# Patient Record
Sex: Male | Born: 1960 | Hispanic: No | Marital: Single | Smoking: Never smoker
Health system: Southern US, Community
[De-identification: ages and names within clinical notes are randomized; demographics above are authoritative.]

## PROBLEM LIST (undated history)

## (undated) DIAGNOSIS — I10 Essential (primary) hypertension: Secondary | ICD-10-CM

## (undated) DIAGNOSIS — C61 Malignant neoplasm of prostate: Secondary | ICD-10-CM

## (undated) HISTORY — PX: TESTICLE REMOVAL: SHX68

## (undated) HISTORY — DX: Essential (primary) hypertension: I10

## (undated) HISTORY — PX: FRACTURE SURGERY: SHX138

## (undated) HISTORY — PX: BICEPS TENDON REPAIR: SHX566

## (undated) HISTORY — PX: CATARACT EXTRACTION: SUR2

## (undated) HISTORY — DX: Malignant neoplasm of prostate: C61

---

## 2005-12-05 ENCOUNTER — Ambulatory Visit (HOSPITAL_COMMUNITY): Admission: RE | Admit: 2005-12-05 | Discharge: 2005-12-06 | Payer: Self-pay | Admitting: Orthopedic Surgery

## 2015-12-01 DIAGNOSIS — H43812 Vitreous degeneration, left eye: Secondary | ICD-10-CM | POA: Diagnosis not present

## 2015-12-01 DIAGNOSIS — H2513 Age-related nuclear cataract, bilateral: Secondary | ICD-10-CM | POA: Diagnosis not present

## 2015-12-01 DIAGNOSIS — H52223 Regular astigmatism, bilateral: Secondary | ICD-10-CM | POA: Diagnosis not present

## 2015-12-01 DIAGNOSIS — H5203 Hypermetropia, bilateral: Secondary | ICD-10-CM | POA: Diagnosis not present

## 2016-05-13 DIAGNOSIS — I10 Essential (primary) hypertension: Secondary | ICD-10-CM | POA: Diagnosis not present

## 2016-05-13 DIAGNOSIS — Z23 Encounter for immunization: Secondary | ICD-10-CM | POA: Diagnosis not present

## 2016-05-13 DIAGNOSIS — Z0000 Encounter for general adult medical examination without abnormal findings: Secondary | ICD-10-CM | POA: Diagnosis not present

## 2016-05-13 DIAGNOSIS — Z8739 Personal history of other diseases of the musculoskeletal system and connective tissue: Secondary | ICD-10-CM | POA: Diagnosis not present

## 2016-05-23 DIAGNOSIS — L821 Other seborrheic keratosis: Secondary | ICD-10-CM | POA: Diagnosis not present

## 2016-05-23 DIAGNOSIS — D225 Melanocytic nevi of trunk: Secondary | ICD-10-CM | POA: Diagnosis not present

## 2016-05-23 DIAGNOSIS — D485 Neoplasm of uncertain behavior of skin: Secondary | ICD-10-CM | POA: Diagnosis not present

## 2016-05-23 DIAGNOSIS — L720 Epidermal cyst: Secondary | ICD-10-CM | POA: Diagnosis not present

## 2016-05-23 DIAGNOSIS — L814 Other melanin hyperpigmentation: Secondary | ICD-10-CM | POA: Diagnosis not present

## 2016-06-07 DIAGNOSIS — L720 Epidermal cyst: Secondary | ICD-10-CM | POA: Diagnosis not present

## 2016-09-02 DIAGNOSIS — J019 Acute sinusitis, unspecified: Secondary | ICD-10-CM | POA: Diagnosis not present

## 2016-09-02 DIAGNOSIS — R05 Cough: Secondary | ICD-10-CM | POA: Diagnosis not present

## 2017-04-25 DIAGNOSIS — M109 Gout, unspecified: Secondary | ICD-10-CM | POA: Diagnosis not present

## 2017-05-02 DIAGNOSIS — H40033 Anatomical narrow angle, bilateral: Secondary | ICD-10-CM | POA: Diagnosis not present

## 2017-05-02 DIAGNOSIS — G44219 Episodic tension-type headache, not intractable: Secondary | ICD-10-CM | POA: Diagnosis not present

## 2017-05-11 DIAGNOSIS — H04123 Dry eye syndrome of bilateral lacrimal glands: Secondary | ICD-10-CM | POA: Diagnosis not present

## 2017-06-15 DIAGNOSIS — Z8739 Personal history of other diseases of the musculoskeletal system and connective tissue: Secondary | ICD-10-CM | POA: Diagnosis not present

## 2017-06-15 DIAGNOSIS — Z0000 Encounter for general adult medical examination without abnormal findings: Secondary | ICD-10-CM | POA: Diagnosis not present

## 2017-06-15 DIAGNOSIS — E785 Hyperlipidemia, unspecified: Secondary | ICD-10-CM | POA: Diagnosis not present

## 2017-06-15 DIAGNOSIS — R946 Abnormal results of thyroid function studies: Secondary | ICD-10-CM | POA: Diagnosis not present

## 2017-09-27 DIAGNOSIS — R946 Abnormal results of thyroid function studies: Secondary | ICD-10-CM | POA: Diagnosis not present

## 2017-09-27 DIAGNOSIS — Z8739 Personal history of other diseases of the musculoskeletal system and connective tissue: Secondary | ICD-10-CM | POA: Diagnosis not present

## 2017-10-18 DIAGNOSIS — M5416 Radiculopathy, lumbar region: Secondary | ICD-10-CM | POA: Diagnosis not present

## 2018-02-01 DIAGNOSIS — K080 Exfoliation of teeth due to systemic causes: Secondary | ICD-10-CM | POA: Diagnosis not present

## 2018-02-08 DIAGNOSIS — K080 Exfoliation of teeth due to systemic causes: Secondary | ICD-10-CM | POA: Diagnosis not present

## 2018-07-25 DIAGNOSIS — E785 Hyperlipidemia, unspecified: Secondary | ICD-10-CM | POA: Diagnosis not present

## 2018-07-25 DIAGNOSIS — R7301 Impaired fasting glucose: Secondary | ICD-10-CM | POA: Diagnosis not present

## 2018-07-25 DIAGNOSIS — I10 Essential (primary) hypertension: Secondary | ICD-10-CM | POA: Diagnosis not present

## 2018-07-25 DIAGNOSIS — Z0000 Encounter for general adult medical examination without abnormal findings: Secondary | ICD-10-CM | POA: Diagnosis not present

## 2018-07-25 DIAGNOSIS — Z8739 Personal history of other diseases of the musculoskeletal system and connective tissue: Secondary | ICD-10-CM | POA: Diagnosis not present

## 2018-08-13 DIAGNOSIS — J069 Acute upper respiratory infection, unspecified: Secondary | ICD-10-CM | POA: Diagnosis not present

## 2019-07-30 DIAGNOSIS — Z0000 Encounter for general adult medical examination without abnormal findings: Secondary | ICD-10-CM | POA: Diagnosis not present

## 2019-07-30 DIAGNOSIS — R7301 Impaired fasting glucose: Secondary | ICD-10-CM | POA: Diagnosis not present

## 2019-07-30 DIAGNOSIS — Z13220 Encounter for screening for lipoid disorders: Secondary | ICD-10-CM | POA: Diagnosis not present

## 2019-07-30 DIAGNOSIS — Z125 Encounter for screening for malignant neoplasm of prostate: Secondary | ICD-10-CM | POA: Diagnosis not present

## 2019-07-30 DIAGNOSIS — Z8739 Personal history of other diseases of the musculoskeletal system and connective tissue: Secondary | ICD-10-CM | POA: Diagnosis not present

## 2019-08-02 DIAGNOSIS — Z0000 Encounter for general adult medical examination without abnormal findings: Secondary | ICD-10-CM | POA: Diagnosis not present

## 2019-09-10 DIAGNOSIS — N402 Nodular prostate without lower urinary tract symptoms: Secondary | ICD-10-CM | POA: Diagnosis not present

## 2019-10-09 DIAGNOSIS — R944 Abnormal results of kidney function studies: Secondary | ICD-10-CM | POA: Diagnosis not present

## 2019-10-21 DIAGNOSIS — Z8042 Family history of malignant neoplasm of prostate: Secondary | ICD-10-CM | POA: Diagnosis not present

## 2019-10-21 DIAGNOSIS — C61 Malignant neoplasm of prostate: Secondary | ICD-10-CM | POA: Diagnosis not present

## 2019-10-21 DIAGNOSIS — N402 Nodular prostate without lower urinary tract symptoms: Secondary | ICD-10-CM | POA: Diagnosis not present

## 2019-10-21 DIAGNOSIS — D075 Carcinoma in situ of prostate: Secondary | ICD-10-CM | POA: Diagnosis not present

## 2019-10-29 DIAGNOSIS — C61 Malignant neoplasm of prostate: Secondary | ICD-10-CM | POA: Diagnosis not present

## 2020-04-27 DIAGNOSIS — C61 Malignant neoplasm of prostate: Secondary | ICD-10-CM | POA: Diagnosis not present

## 2020-05-04 DIAGNOSIS — N402 Nodular prostate without lower urinary tract symptoms: Secondary | ICD-10-CM | POA: Diagnosis not present

## 2020-05-04 DIAGNOSIS — C61 Malignant neoplasm of prostate: Secondary | ICD-10-CM | POA: Diagnosis not present

## 2020-05-04 DIAGNOSIS — G44219 Episodic tension-type headache, not intractable: Secondary | ICD-10-CM | POA: Diagnosis not present

## 2020-05-15 DIAGNOSIS — H1013 Acute atopic conjunctivitis, bilateral: Secondary | ICD-10-CM | POA: Diagnosis not present

## 2020-05-28 DIAGNOSIS — H04123 Dry eye syndrome of bilateral lacrimal glands: Secondary | ICD-10-CM | POA: Diagnosis not present

## 2020-05-28 DIAGNOSIS — H40033 Anatomical narrow angle, bilateral: Secondary | ICD-10-CM | POA: Diagnosis not present

## 2020-06-18 DIAGNOSIS — H25811 Combined forms of age-related cataract, right eye: Secondary | ICD-10-CM | POA: Diagnosis not present

## 2020-06-18 DIAGNOSIS — Z01818 Encounter for other preprocedural examination: Secondary | ICD-10-CM | POA: Diagnosis not present

## 2020-07-02 DIAGNOSIS — H25811 Combined forms of age-related cataract, right eye: Secondary | ICD-10-CM | POA: Diagnosis not present

## 2020-07-02 DIAGNOSIS — H2511 Age-related nuclear cataract, right eye: Secondary | ICD-10-CM | POA: Diagnosis not present

## 2020-09-23 ENCOUNTER — Other Ambulatory Visit: Payer: Self-pay | Admitting: Urology

## 2020-09-23 DIAGNOSIS — C61 Malignant neoplasm of prostate: Secondary | ICD-10-CM

## 2020-10-07 DIAGNOSIS — Z125 Encounter for screening for malignant neoplasm of prostate: Secondary | ICD-10-CM | POA: Diagnosis not present

## 2020-10-07 DIAGNOSIS — Z13220 Encounter for screening for lipoid disorders: Secondary | ICD-10-CM | POA: Diagnosis not present

## 2020-10-07 DIAGNOSIS — Z0000 Encounter for general adult medical examination without abnormal findings: Secondary | ICD-10-CM | POA: Diagnosis not present

## 2020-10-07 DIAGNOSIS — Z8739 Personal history of other diseases of the musculoskeletal system and connective tissue: Secondary | ICD-10-CM | POA: Diagnosis not present

## 2020-10-07 DIAGNOSIS — R7301 Impaired fasting glucose: Secondary | ICD-10-CM | POA: Diagnosis not present

## 2020-10-16 ENCOUNTER — Ambulatory Visit
Admission: RE | Admit: 2020-10-16 | Discharge: 2020-10-16 | Disposition: A | Discharge status: Home / Self Care | Payer: Federal, State, Local not specified - PPO | Source: Ambulatory Visit | Attending: Urology | Admitting: Urology

## 2020-10-16 ENCOUNTER — Other Ambulatory Visit: Payer: Self-pay

## 2020-10-16 DIAGNOSIS — C61 Malignant neoplasm of prostate: Secondary | ICD-10-CM | POA: Diagnosis not present

## 2020-10-16 MED ORDER — GADOBENATE DIMEGLUMINE 529 MG/ML IV SOLN
19.0000 mL | Freq: Once | INTRAVENOUS | Status: AC | PRN
  Administered 2020-10-16: 19 mL via INTRAVENOUS

## 2020-11-03 DIAGNOSIS — C61 Malignant neoplasm of prostate: Secondary | ICD-10-CM | POA: Diagnosis not present

## 2020-11-20 DIAGNOSIS — Z0000 Encounter for general adult medical examination without abnormal findings: Secondary | ICD-10-CM | POA: Diagnosis not present

## 2021-04-22 DIAGNOSIS — C61 Malignant neoplasm of prostate: Secondary | ICD-10-CM | POA: Diagnosis not present

## 2021-04-29 DIAGNOSIS — C61 Malignant neoplasm of prostate: Secondary | ICD-10-CM | POA: Diagnosis not present

## 2021-04-29 DIAGNOSIS — N402 Nodular prostate without lower urinary tract symptoms: Secondary | ICD-10-CM | POA: Diagnosis not present

## 2021-05-27 DIAGNOSIS — H40033 Anatomical narrow angle, bilateral: Secondary | ICD-10-CM | POA: Diagnosis not present

## 2021-05-27 DIAGNOSIS — H1013 Acute atopic conjunctivitis, bilateral: Secondary | ICD-10-CM | POA: Diagnosis not present

## 2021-09-16 DIAGNOSIS — Z86010 Personal history of colonic polyps: Secondary | ICD-10-CM | POA: Diagnosis not present

## 2021-09-16 DIAGNOSIS — D128 Benign neoplasm of rectum: Secondary | ICD-10-CM | POA: Diagnosis not present

## 2021-09-16 DIAGNOSIS — K648 Other hemorrhoids: Secondary | ICD-10-CM | POA: Diagnosis not present

## 2021-10-25 DIAGNOSIS — D075 Carcinoma in situ of prostate: Secondary | ICD-10-CM | POA: Diagnosis not present

## 2021-10-25 DIAGNOSIS — C61 Malignant neoplasm of prostate: Secondary | ICD-10-CM | POA: Diagnosis not present

## 2021-11-01 DIAGNOSIS — N402 Nodular prostate without lower urinary tract symptoms: Secondary | ICD-10-CM | POA: Diagnosis not present

## 2021-11-01 DIAGNOSIS — C61 Malignant neoplasm of prostate: Secondary | ICD-10-CM | POA: Diagnosis not present

## 2021-11-09 ENCOUNTER — Encounter (INDEPENDENT_AMBULATORY_CARE_PROVIDER_SITE_OTHER): Payer: Self-pay | Admitting: Ophthalmology

## 2021-11-09 ENCOUNTER — Ambulatory Visit (INDEPENDENT_AMBULATORY_CARE_PROVIDER_SITE_OTHER): Payer: Federal, State, Local not specified - PPO | Admitting: Ophthalmology

## 2021-11-09 ENCOUNTER — Other Ambulatory Visit: Payer: Self-pay

## 2021-11-09 DIAGNOSIS — H10013 Acute follicular conjunctivitis, bilateral: Secondary | ICD-10-CM | POA: Diagnosis not present

## 2021-11-09 DIAGNOSIS — H35033 Hypertensive retinopathy, bilateral: Secondary | ICD-10-CM

## 2021-11-09 DIAGNOSIS — Z961 Presence of intraocular lens: Secondary | ICD-10-CM | POA: Diagnosis not present

## 2021-11-09 DIAGNOSIS — H3321 Serous retinal detachment, right eye: Secondary | ICD-10-CM | POA: Diagnosis not present

## 2021-11-09 DIAGNOSIS — I10 Essential (primary) hypertension: Secondary | ICD-10-CM

## 2021-11-09 DIAGNOSIS — H25812 Combined forms of age-related cataract, left eye: Secondary | ICD-10-CM

## 2021-11-09 NOTE — Progress Notes (Signed)
?[facility] Note ? ?11/09/2021 ?  ? ?CHIEF COMPLAINT ?Patient presents for Retina Evaluation ? ? ?HISTORY OF PRESENT ILLNESS: ?Cristian Mcgee is a 61 y.o. male who presents to the clinic today for:  ? ?HPI   ? ? Retina Evaluation   ?In right eye.  This started 3 days ago.  Associated Symptoms Floaters.  Context:  distance vision, mid-range vision and near vision.  I, the attending physician,  performed the HPI with the patient and updated documentation appropriately. ? ?  ?  ? ? Comments   ?Retina eval per Dr. Marin Comment for RD OD- Saturday he noticed floaters OD.  Denies FOL's.  He woke up yesterday morning and had lost vision in that eye. About 85-90% of vision is gone.  Only able to see superotemporally.  ? ?  ?  ?Last edited by Bernarda Caffey, MD on 11/09/2021  1:01 PM.  ?  ?Pt states he noticed new floaters OD on Saturday, on Sunday, he started to notice that he was losing vision, he states yesterday, he woke up with almost all his vision gone, pt denies any heart or lung problems ? ?Referring physician: ?Harlen Labs, MD ?[phone] [facility] [address] ?[place],  [place] [address] ? ?HISTORICAL INFORMATION:  ? ?Selected notes from the [place] ?Referred by Dr. Anthony Sar for RD OD ?LEE:  ?Ocular Hx- ?PMH- ?  ? ?CURRENT MEDICATIONS: ?No current outpatient medications on file. (Ophthalmic Drugs)  ? ?No current facility-administered medications for this visit. (Ophthalmic Drugs)  ? ?Current Outpatient Medications (Other)  ?Medication Sig  ? allopurinol (ZYLOPRIM) 100 MG tablet Take 100 mg by mouth daily.  ? olmesartan (BENICAR) 20 MG tablet Take 20 mg by mouth daily.  ? ?No current facility-administered medications for this visit. (Other)  ? ?REVIEW OF SYSTEMS: ?ROS   ?Positive for: Eyes ?Negative for: Constitutional, Gastrointestinal, Neurological, Skin, Genitourinary, Musculoskeletal, HENT, Endocrine, Cardiovascular, Respiratory, Psychiatric, Allergic/Imm, Heme/Lymph ?Last edited by Leonie Douglas, COA  on 11/09/2021 10:28 AM.  ?  ? ?ALLERGIES ?No Known Allergies ? ?PAST MEDICAL HISTORY ?Past Medical History:  ?Diagnosis Date  ? Hypertension   ? Prostate cancer ([place])   ? ?Past Surgical History:  ?Procedure Laterality Date  ? CATARACT EXTRACTION Right   ? ?FAMILY HISTORY ?Family History  ?Problem Relation Age of Onset  ? Retinal detachment Mother   ? Diabetes Father   ? ?SOCIAL HISTORY ?Social History  ? ?Tobacco Use  ? Smoking status: Never  ? Smokeless tobacco: Never  ?  ? ?  ?OPHTHALMIC EXAM: ? ?Base Eye Exam   ? ? Visual Acuity (Snellen - Linear)   ? ?   Right Left  ? Dist [place] CF 1' temporally 20/20-  ? Dist ph [place] NI   ? ?  ?  ? ? Tonometry (Tonopen, 10:35 AM)   ? ?   Right Left  ? Pressure 19 18  ? ?  ?  ? ? Pupils   ? ?   Shape  ? Right Dilated  ? Left Dilated  ? ?  ?  ? ? Visual Fields (Counting fingers)   ? ?   Left Right  ?  Full   ? Restrictions  Total superior nasal, inferior nasal deficiencies; Partial inner superior temporal, inferior temporal deficiencies  ? ?  ?  ? ? Extraocular Movement   ? ?   Right Left  ?  Full Full  ? ?  ?  ? ?  Neuro/Psych   ? ? Oriented x3: Yes  ? Mood/Affect: Normal  ? ?  ?  ? ? Dilation   ? ? Both eyes: Dilated @ 09:15 AM  ? ?  ?  ? ?  ? ?Slit Lamp and Fundus Exam   ? ? Slit Lamp Exam   ? ?   Right Left  ? Lids/Lashes Dermatochalasis - upper lid Dermatochalasis - upper lid  ? Conjunctiva/Sclera Trace Injection, nasal and temporal pinguecula Trace Injection, nasal and temporal pinguecula  ? Cornea trace PEE, well healed cataract wound mild tear film debris  ? Anterior Chamber Deep and quiet Deep and quiet  ? Iris Round and dilated Round and dilated  ? Lens PC IOL in good position 2+ Nuclear sclerosis, 2+ Cortical cataract  ? Anterior Vitreous Vitreous syneresis, vitreous condensations / debris Vitreous syneresis  ? ?  ?  ? ? Fundus Exam   ? ?   Right Left  ? Disc partially obscured by bullous RD, Pink and Sharp Pink and [place]  ? C/D Ratio 0.2 0.2  ? Macula Detached,  partially obscured by bullous RD Flat, Good foveal reflex, RPE mottling, No heme or edema  ? Vessels attenuated, Tortuous Mild copper wiring  ? Periphery Bullous ST detachment from 0900-1200 extending posteriorly into macula, no obvious RT on depression, paving stone degeneration inferiorly Attached, mild pavingstone degeneration inferiorly  ? ?  ?  ? ?  ? ? ?IMAGING AND PROCEDURES  ?Imaging and Procedures for 11/09/2021 ? ?OCT, Retina - OU - Both Eyes   ? ?   ?Right Eye ?Quality was good. Progression has no prior data. Findings include abnormal foveal contour, intraretinal fluid, no SRF (Bullous macula and fovea involving RD with fine vitreous opacities).  ? ?Left Eye ?Quality was good. Central Foveal Thickness: 312. Findings include normal foveal contour, no IRF, no SRF, vitreomacular adhesion .  ? ?Notes ?*Images captured and stored on drive ? ?Diagnosis / Impression:  ?OD: bullous macula and fovea-involving RD - +SRF, mild IRF/cystic changes within detached retina ?OS: NFP; no IRF/SRF  ? ?Clinical management:  ?See below ? ?Abbreviations: NFP - Normal foveal profile. CME - cystoid macular edema. PED - pigment epithelial detachment. IRF - intraretinal fluid. SRF - subretinal fluid. EZ - ellipsoid zone. ERM - epiretinal membrane. ORA - outer retinal atrophy. ORT - outer retinal tubulation. SRHM - subretinal hyper-reflective material. IRHM - intraretinal hyper-reflective material ? ? ?  ? ?Color Fundus Photography Optos - OU - Both Eyes   ? ?   ?Right Eye ?Progression has no prior data. Disc findings include normal observations. Macula : detached. Vessels : tortuous vessels, attenuated. Periphery : detachment.  ? ?Left Eye ?Progression has no prior data. Disc findings include normal observations. Macula : normal observations. Vessels : tortuous vessels, attenuated. Periphery : normal observations.  ? ?Notes ?**Images stored on drive** ? ?Impression: ?OD: bullous macula and fovea- involving RD, ST quad from  0900-1200 ?OS: normal study ? ?  ? ? ?  ?  ? ?  ?ASSESSMENT/PLAN: ? ?  ICD-10-CM   ?1. Right retinal detachment  H33.21 OCT, Retina - OU - Both Eyes  ?  Color Fundus Photography Optos - OU - Both Eyes  ?  ?2. Essential hypertension  I10   ?  ?3. Hypertensive retinopathy of both eyes  H35.033 OCT, Retina - OU - Both Eyes  ?  ?4. Pseudophakia  Z96.1   ?  ?5. Combined forms of age-related cataract of left  eye  H25.812   ?  ? ?Rhegmatogenous retinal detachment, OD ?- bullous superior mac off detachment, onset of foveal involvement yesterday, 11/08/2021 by history ?- detached from 0900 to 12 oclock ?- Pt reports +family history of RD in mother ?- The incidence, risk factors, and natural history of retinal detachment was discussed with patient.   ?- Potential treatment options including delimiting laser, pneumatic retinopexy, scleral buckle, and vitrectomy, cryotherapy and laser, and the use of air, gas, and oil discussed with patient. ?- The risks of blindness, loss of vision, infection, hemorrhage, cataract progression or lens displacement were discussed with patient. ?- recommend SBP + 25g PPV/EL/Gas OD under general anesthesia ?- pt wishes to proceed with surgery ?- RBA of procedure discussed, questions answered ?- informed consent obtained and signed ?- case scheduled for Thursday, March 30 at 3:30, [facility] OR 8 ?- f/u POD1 ? ?2,3. Hypertensive retinopathy OU ?- discussed importance of tight BP control ?- monitor ? ?4. Pseudophakia OD ? - s/p CE/IOL w/ Dr. Alanda Slim ? - IOL in good position, doing well ? - monitor  ? ?5. Mixed Cataract OS ?- The symptoms of cataract, surgical options, and treatments and risks were discussed with patient. ?- discussed diagnosis and progression ?- monitor for now  ? ?Ophthalmic Meds Ordered this visit:  ?No orders of the defined types were placed in this encounter. ?  ? ?Return in about 3 days (around 11/12/2021) for POV. ? ?There are no Patient Instructions on file for this  visit. ? ? ?Explained the diagnoses, plan, and follow up with the patient and they expressed understanding.  Patient expressed understanding of the importance of proper follow up care.  ? ?This document serves as a record of service

## 2021-11-10 ENCOUNTER — Other Ambulatory Visit: Payer: Self-pay

## 2021-11-10 ENCOUNTER — Encounter (HOSPITAL_COMMUNITY): Payer: Self-pay | Admitting: Ophthalmology

## 2021-11-10 DIAGNOSIS — M109 Gout, unspecified: Secondary | ICD-10-CM | POA: Diagnosis not present

## 2021-11-10 DIAGNOSIS — I10 Essential (primary) hypertension: Secondary | ICD-10-CM | POA: Diagnosis not present

## 2021-11-10 DIAGNOSIS — R7301 Impaired fasting glucose: Secondary | ICD-10-CM | POA: Diagnosis not present

## 2021-11-10 NOTE — Progress Notes (Addendum)
Anesthesia Chart Review: SAME DAY WORK-UP ? Case: [id number] Date/Time: 11/11/21 1515  ? Procedure: VITRECTOMY 25 GAUGE WITH SCLERAL BUCKLE (Right)  ? Anesthesia type: General  ? Pre-op diagnosis: right eye, retinal detachment  ? Location: [facility] OR ROOM 08 / [place] OR  ? Surgeons: Bernarda Caffey, MD  ? ?  ? ? ?DISCUSSION: Patient is a 61 year old male scheduled for the above procedure. ? ?History includes never smoker, HTN, prostate cancer. ? ?Patient reportedly had preoperative medical evaluation at Dr. Jordan Hawks Rankin's office on 11/10/21. A BMET, CBD with diff, and A1C were drawn. Results are pending. Records requested and limited review available in [facility]. Office said they would fax over records/clearance once labs had been reviewed.  ? ?Anesthesia team evaluation and follow-up requested records on the day of surgery.  ? ?UPDATE 11/11/21 10:37 AM: RN staff contacted Dr. Dahlia Bailiff office for records. 11/10/2021 lab results received from [place].  Results included A1c 5.8%, glucose 107, creatinine 1.23, sodium 139, potassium 5.6, BUN 18, calcium 9.9, albumin 5.0, WBC 10.6, hemoglobin 15.3, hematocrit 44.7, platelet count 225. ? ?VS: Wt 94.3 kg  ON 10/2921 ([facility]): WT 205.2 lbs, HT 70 in, BMI 29.44, BP 131/78, O2 sat 99% on RA.   ? ?  ?PROVIDERS: ?Rankins, Bill Salinas, MD is PCP [facility]) ?Alexis Frock, MD is urologist ? ? ?LABS: See DISCUSSION. ? ? ?IMAGES: ?MRI prostate 10/16/20: ?IMPRESSION: ?1. 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 ?2. Hazy low T2 signal stranding in both peripheral zones, likely ?postinflammatory. ?3. Mild prostatomegaly. 08 cubic cm. ?  ? ?EKG: EKG on the day of surgery (unless receive tracing without past year) anticipated given history.  ? ? ?CV: N/A ? ?Past Medical History:  ?Diagnosis Date  ? Hypertension   ? Prostate cancer ([place])   ? ? ?Past Surgical History:  ?Procedure  Laterality Date  ? BICEPS TENDON REPAIR Right   ? "reattach bicep on right arm"- 18 years ago per patient (11/10/21)  ? CATARACT EXTRACTION Right   ? FRACTURE SURGERY Left   ? 35 years ago  ? TESTICLE REMOVAL    ? as a child had 1 testicle removed  ? ? ?MEDICATIONS: ?No current facility-administered medications for this encounter.  ? ? allopurinol (ZYLOPRIM) 100 MG tablet  ? olmesartan (BENICAR) 20 MG tablet  ? ? ?Myra Gianotti, PA-C ?Surgical Short Stay/Anesthesiology ?[facility] Phone [phone] ?[facility] Phone [phone] ?11/10/2021 12:50 PM ? ? ? ? ? ? ? ? ?

## 2021-11-10 NOTE — Anesthesia Preprocedure Evaluation (Addendum)
Anesthesia Evaluation  ?Patient identified by MRN, date of birth, ID band ?Patient awake ? ? ? ?Reviewed: ?Allergy & Precautions, NPO status , Patient's Chart, lab work & pertinent test results ? ?History of Anesthesia Complications ?Negative for: history of anesthetic complications ? ?Airway ?Mallampati: III ? ?TM Distance: >3 FB ?Neck ROM: Full ? ? ? Dental ? ?(+) Teeth Intact, Dental Advisory Given ?  ?Pulmonary ?neg pulmonary ROS,  ?  ?breath sounds clear to auscultation ? ? ? ? ? ? Cardiovascular ?hypertension, Pt. on medications ? ?Rhythm:Regular  ? ?  ?Neuro/Psych ?negative neurological ROS ?   ? GI/Hepatic ?negative GI ROS, Neg liver ROS,   ?Endo/Other  ?negative endocrine ROS ? Renal/GU ?negative Renal ROS  ? ?H/o prostate cancer ? ?  ?Musculoskeletal ?negative musculoskeletal ROS ?(+)  ? Abdominal ?  ?Peds ? Hematology ?negative hematology ROS ?(+)   ?Anesthesia Other Findings ? ? Reproductive/Obstetrics ? ?  ? ? ? ? ? ? ? ? ? ? ? ? ? ?  ?  ? ? ? ?Anesthesia Physical ?Anesthesia Plan ? ?ASA: 2 ? ?Anesthesia Plan: General  ? ?Post-op Pain Management: Minimal or no pain anticipated  ? ?Induction: Intravenous ? ?PONV Risk Score and Plan: 2 and Ondansetron, Treatment may vary due to age or medical condition, Midazolam and Dexamethasone ? ?Airway Management Planned: Oral ETT ? ?Additional Equipment: None ? ?Intra-op Plan:  ? ?Post-operative Plan: Extubation in OR ? ?Informed Consent: I have reviewed the patients History and Physical, chart, labs and discussed the procedure including the risks, benefits and alternatives for the proposed anesthesia with the patient or authorized representative who has indicated his/her understanding and acceptance.  ? ? ? ?Dental advisory given ? ?Plan Discussed with:  ? ?Anesthesia Plan Comments: (PAT note written 11/10/2021 by Myra Gianotti, PA-C. PCP Dr. Dahlia Bailiff office to fax over preoperative evaluation and CBC, BMET, A1c results from  11/10/21 once available on 11/11/21.  ? ?11/10/2021 lab results received from [place].  Results included A1c 5.8%, glucose 107, creatinine 1.23, sodium 139, potassium 5.6, BUN 18, calcium 9.9, albumin 5.0, WBC 10.6, hemoglobin 15.3, hematocrit 44.7, platelet count 225.)  ? ? ? ? ?Anesthesia Quick Evaluation ? ?

## 2021-11-10 NOTE — H&P (Signed)
Cristian Mcgee is an 61 y.o. male.   ? ?Chief Complaint: retinal detachment, OD ? ?HPI: Pt with 5-day history of floaters and progressive vision loss OD. On dilated exam, found to have a macula-involving retinal detachment of the right eye. After a discussion of the risks, benefits and alternatives to surgery, the patient has elected to proceed with surgical repair of the detachment in the right eye under general anesthesia. ? ?Past Medical History:  ?Diagnosis Date  ? Hypertension   ? Prostate cancer ([place])   ? ? ?Past Surgical History:  ?Procedure Laterality Date  ? CATARACT EXTRACTION Right   ? ? ?Family History  ?Problem Relation Age of Onset  ? Retinal detachment Mother   ? Diabetes Father   ? ?Social History:  reports that he has never smoked. He has never used smokeless tobacco. No history on file for alcohol use and drug use. ? ?Allergies: No Known Allergies ? ?No medications prior to admission.  ? ? ?Review of systems otherwise negative ? ?There were no vitals taken for this visit. ? ?Physical exam: ?Mental status: oriented x3. ?Eyes: See eye exam associated with this date of surgery ?Ears, Nose, Throat: within normal limits ?Neck: Within Normal limits ?General: within normal limits ?Chest: Within normal limits ?Breast: deferred ?Heart: Within normal limits ?Abdomen: Within normal limits ?GU: deferred ?Extremities: within normal limits ?Skin: within normal limits ? ?Assessment/Plan ?Retinal detachment, right eye ?Plan: To [facility] for SBP + 25g PPV w/ endolaser and gas, RIGHT EYE, under general anesthesia ?- case scheduled for Thursday, Mar 30, [facility] OR 08 ? ?Gardiner Sleeper, M.D., Ph.D. ?Vitreoretinal Surgeon ?[facility] ? ?  ?

## 2021-11-10 NOTE — Progress Notes (Addendum)
SDW CALL ? ?Patient was given pre-op instructions over the phone. The opportunity was given for the patient to ask questions. No further questions asked. Patient verbalized understanding of instructions given. ? ? ?PCP - Milagros Evener, MD- pt saw PCP today for medical clearance- Pt states lab work was done at PCP appointment, pt unsure which lab work was done. [name] at [facility] called and asked to fax notes, labs and clearance sent. Per office, pt had HgbA1c, CBC, and BMET- labs have not resulted yet. Office to fax over office note/clearance note, and labs as soon as possible.  ? ?Cardiologist - denies ? ?PPM/ICD - denies ?Chest x-ray - N/A ?EKG - DOS ?Stress Test - denies ?ECHO - denies ?Cardiac Cath - denies ? ?Sleep Study - denies ? ?COVID TEST- N/A ? ?Anesthesia review: Ebony Hail and Jeneen Rinks notified of waiting for medical clearance/labs.  ? ?Patient denies shortness of breath, fever, cough and chest pain over the phone call ? ? ? ?Surgical Instructions ? ? ? Your procedure is scheduled on Thursday, March 30 ? Report to [facility] Main Entrance "A" at 1:00PM., then check in with the Admitting office. ? Call this number if you have problems the morning of surgery: ? [phone] ? ? ? Remember: ? Do not eat after midnight the night before your surgery ? ?You may drink clear liquids until 12:30PM the morning of your surgery.   ?Clear liquids allowed are: Water, Non-Citrus Juices (without pulp), Carbonated Beverages, Clear Tea, Black Coffee ONLY (NO MILK, CREAM OR POWDERED CREAMER of any kind), and Gatorade ? ? Take these medicines the morning of surgery with A SIP OF WATER: allopurinol ? ? ?As of today, STOP taking any Aspirin (unless otherwise instructed by your surgeon) Aleve, Naproxen, Ibuprofen, Motrin, Advil, Goody's, BC's, all herbal medications, fish oil, and all vitamins. ? ?[facility] is not responsible for any belongings or valuables.  ? ?Do NOT Smoke (Tobacco/Vaping)  24 hours prior to  your procedure ?  ?Contacts, glasses, hearing aids, dentures or partials may not be worn into surgery, please bring cases for these belongings ?  ?Patients discharged the day of surgery will not be allowed to drive home, and someone needs to stay with them for 24 hours. ? ? ?SURGICAL WAITING ROOM VISITATION ?No visitors are allowed in pre-op area with patient.  ?Patients having surgery or a procedure in a hospital may have two support people in the waiting room. ?  ? ? ? ?Special instructions:   ? ?Oral Hygiene is also important to reduce your risk of infection.  Remember - BRUSH YOUR TEETH THE MORNING OF SURGERY WITH YOUR REGULAR TOOTHPASTE ? ? ?Day of Surgery: ? ?Take a shower the day of or night before with antibacterial soap. ?Wear Clean/Comfortable clothing the morning of surgery ?Do not apply any deodorants/lotions.   ?Do not wear jewelry or makeup ?Do not wear lotions, powders, perfumes/colognes, or deodorant. ?Do not shave 48 hours prior to surgery.  Men may shave face and neck. ?Do not bring valuables to the hospital. ?Do not wear nail polish, gel polish, artificial nails, or any other type of covering on natural nails (fingers and toes) ?If you have artificial nails or gel coating that need to be removed by a nail salon, please have this removed prior to surgery. Artificial nails or gel coating may interfere with anesthesia's ability to adequately monitor your vital signs. ?Remember to brush your teeth WITH YOUR REGULAR TOOTHPASTE. ? ? ? ? ? ?

## 2021-11-11 ENCOUNTER — Ambulatory Visit (HOSPITAL_COMMUNITY): Payer: Federal, State, Local not specified - PPO | Admitting: Vascular Surgery

## 2021-11-11 ENCOUNTER — Ambulatory Visit (HOSPITAL_COMMUNITY)
Admission: RE | Admit: 2021-11-11 | Discharge: 2021-11-11 | Disposition: A | Discharge status: Home / Self Care | Payer: Federal, State, Local not specified - PPO | Attending: Ophthalmology | Admitting: Ophthalmology

## 2021-11-11 ENCOUNTER — Encounter (HOSPITAL_COMMUNITY): Payer: Self-pay | Admitting: Ophthalmology

## 2021-11-11 ENCOUNTER — Other Ambulatory Visit: Payer: Self-pay

## 2021-11-11 ENCOUNTER — Encounter (HOSPITAL_COMMUNITY)
Admission: RE | Disposition: A | Discharge status: Home / Self Care | Payer: Self-pay | Source: Home / Self Care | Attending: Ophthalmology

## 2021-11-11 DIAGNOSIS — H33001 Unspecified retinal detachment with retinal break, right eye: Secondary | ICD-10-CM | POA: Insufficient documentation

## 2021-11-11 DIAGNOSIS — H338 Other retinal detachments: Secondary | ICD-10-CM | POA: Diagnosis not present

## 2021-11-11 DIAGNOSIS — Z8546 Personal history of malignant neoplasm of prostate: Secondary | ICD-10-CM | POA: Diagnosis not present

## 2021-11-11 DIAGNOSIS — I10 Essential (primary) hypertension: Secondary | ICD-10-CM | POA: Diagnosis not present

## 2021-11-11 HISTORY — PX: VITRECTOMY 25 GAUGE WITH SCLERAL BUCKLE: SHX6183

## 2021-11-11 HISTORY — PX: PHOTOCOAGULATION WITH LASER: SHX6027

## 2021-11-11 HISTORY — PX: GAS INSERTION: SHX5336

## 2021-11-11 HISTORY — PX: GAS/FLUID EXCHANGE: SHX5334

## 2021-11-11 LAB — BASIC METABOLIC PANEL
Anion gap: 10 (ref 5–15)
BUN: 17 mg/dL (ref 8–23)
CO2: 20 mmol/L — ABNORMAL LOW (ref 22–32)
Calcium: 8.9 mg/dL (ref 8.9–10.3)
Chloride: 104 mmol/L (ref 98–111)
Creatinine, Ser: 1.17 mg/dL (ref 0.61–1.24)
GFR, Estimated: >60 mL/min (ref >60)
Glucose, Bld: 102 mg/dL — ABNORMAL HIGH (ref 70–99)
Potassium: 3.8 mmol/L (ref 3.5–5.1)
Sodium: 134 mmol/L — ABNORMAL LOW (ref 135–145)

## 2021-11-11 LAB — CBC
HCT: 41.9 % (ref 39.0–52.0)
Hemoglobin: 14.2 g/dL (ref 13.0–17.0)
MCH: 30 pg (ref 26.0–34.0)
MCHC: 33.9 g/dL (ref 30.0–36.0)
MCV: 88.4 fL (ref 80.0–100.0)
Platelets: 200 10*3/uL (ref 150–400)
RBC: 4.74 MIL/uL (ref 4.22–5.81)
RDW: 13.1 % (ref 11.5–15.5)
WBC: 8.3 10*3/uL (ref 4.0–10.5)
nRBC: 0 % (ref 0.0–0.2)

## 2021-11-11 SURGERY — VITRECTOMY, USING 25-GAUGE INSTRUMENTS, WITH SCLERAL BUCKLING
Anesthesia: General | Site: Eye | Laterality: Right

## 2021-11-11 MED ORDER — LIDOCAINE 2% (20 MG/ML) 5 ML SYRINGE
INTRAMUSCULAR | Status: AC
  Filled 2021-11-11: qty 5

## 2021-11-11 MED ORDER — TRIAMCINOLONE ACETONIDE 40 MG/ML IJ SUSP
INTRAMUSCULAR | Status: DC | PRN
  Administered 2021-11-11: .5 mL via INTRAMUSCULAR

## 2021-11-11 MED ORDER — BRIMONIDINE TARTRATE 0.2 % OP SOLN
OPHTHALMIC | Status: DC | PRN
  Administered 2021-11-11: 1 [drp] via OPHTHALMIC

## 2021-11-11 MED ORDER — DEXTROSE 5 % IV SOLN
INTRAVENOUS | Status: AC
  Filled 2021-11-11: qty 100

## 2021-11-11 MED ORDER — LACTATED RINGERS IV SOLN
INTRAVENOUS | Status: DC | PRN
Start: 2021-11-11 — End: 2021-11-11

## 2021-11-11 MED ORDER — STERILE WATER FOR INJECTION IJ SOLN
INTRAMUSCULAR | Status: AC
  Filled 2021-11-11: qty 10

## 2021-11-11 MED ORDER — BSS IO SOLN
INTRAOCULAR | Status: AC
  Filled 2021-11-11: qty 15

## 2021-11-11 MED ORDER — SODIUM CHLORIDE (PF) 0.9 % IJ SOLN
INTRAMUSCULAR | Status: AC
  Filled 2021-11-11: qty 10

## 2021-11-11 MED ORDER — PREDNISOLONE ACETATE 1 % OP SUSP
OPHTHALMIC | Status: AC
  Filled 2021-11-11: qty 5

## 2021-11-11 MED ORDER — EPHEDRINE 5 MG/ML INJ
INTRAVENOUS | Status: AC
  Filled 2021-11-11: qty 5

## 2021-11-11 MED ORDER — CHLORHEXIDINE GLUCONATE 0.12 % MT SOLN
OROMUCOSAL | Status: AC
  Administered 2021-11-11: 15 mL via OROMUCOSAL
  Filled 2021-11-11: qty 15

## 2021-11-11 MED ORDER — CYCLOPENTOLATE HCL 1 % OP SOLN
1.0000 [drp] | OPHTHALMIC | Status: AC | PRN
  Administered 2021-11-11 (×3): 1 [drp] via OPHTHALMIC
  Filled 2021-11-11: qty 2

## 2021-11-11 MED ORDER — BACITRACIN-POLYMYXIN B 500-10000 UNIT/GM OP OINT
TOPICAL_OINTMENT | OPHTHALMIC | Status: DC | PRN
  Administered 2021-11-11: 1 via OPHTHALMIC

## 2021-11-11 MED ORDER — EPHEDRINE SULFATE-NACL 50-0.9 MG/10ML-% IV SOSY
PREFILLED_SYRINGE | INTRAVENOUS | Status: DC | PRN
  Administered 2021-11-11 (×5): 5 mg via INTRAVENOUS

## 2021-11-11 MED ORDER — BUPIVACAINE HCL (PF) 0.75 % IJ SOLN
INTRAMUSCULAR | Status: AC
  Filled 2021-11-11: qty 10

## 2021-11-11 MED ORDER — BSS PLUS IO SOLN
INTRAOCULAR | Status: AC
  Filled 2021-11-11: qty 500

## 2021-11-11 MED ORDER — TRIAMCINOLONE ACETONIDE 40 MG/ML IJ SUSP
INTRAMUSCULAR | Status: AC
  Filled 2021-11-11: qty 5

## 2021-11-11 MED ORDER — GATIFLOXACIN 0.5 % OP SOLN
OPHTHALMIC | Status: DC | PRN
  Administered 2021-11-11: 1 [drp] via OPHTHALMIC

## 2021-11-11 MED ORDER — ROCURONIUM BROMIDE 10 MG/ML (PF) SYRINGE
PREFILLED_SYRINGE | INTRAVENOUS | Status: DC | PRN
  Administered 2021-11-11: 70 mg via INTRAVENOUS
  Administered 2021-11-11: 10 mg via INTRAVENOUS
  Administered 2021-11-11: 30 mg via INTRAVENOUS
  Administered 2021-11-11: 10 mg via INTRAVENOUS
  Administered 2021-11-11: 20 mg via INTRAVENOUS

## 2021-11-11 MED ORDER — PHENYLEPHRINE HCL 10 % OP SOLN
1.0000 [drp] | OPHTHALMIC | Status: AC | PRN
  Administered 2021-11-11 (×3): 1 [drp] via OPHTHALMIC
  Filled 2021-11-11: qty 5

## 2021-11-11 MED ORDER — EPINEPHRINE PF 1 MG/ML IJ SOLN
INTRAMUSCULAR | Status: AC
  Filled 2021-11-11: qty 1

## 2021-11-11 MED ORDER — PREDNISOLONE ACETATE 1 % OP SUSP
OPHTHALMIC | Status: DC | PRN
  Administered 2021-11-11: 1 [drp]

## 2021-11-11 MED ORDER — CHLORHEXIDINE GLUCONATE 0.12 % MT SOLN: 15.0000 mL | Freq: Once | OROMUCOSAL | Status: AC

## 2021-11-11 MED ORDER — LIDOCAINE HCL (PF) 1 % IJ SOLN
INTRAMUSCULAR | Status: DC | PRN
  Administered 2021-11-11: 5 mL

## 2021-11-11 MED ORDER — LIDOCAINE HCL 1 % IJ SOLN
INTRAMUSCULAR | Status: AC
  Filled 2021-11-11: qty 20

## 2021-11-11 MED ORDER — EPINEPHRINE PF 1 MG/ML IJ SOLN
INTRAOCULAR | Status: DC | PRN
  Administered 2021-11-11: 500 mL

## 2021-11-11 MED ORDER — STERILE WATER FOR INJECTION IJ SOLN
INTRAMUSCULAR | Status: DC | PRN
  Administered 2021-11-11: .5 mL

## 2021-11-11 MED ORDER — ROCURONIUM BROMIDE 10 MG/ML (PF) SYRINGE
PREFILLED_SYRINGE | INTRAVENOUS | Status: AC
  Filled 2021-11-11: qty 10

## 2021-11-11 MED ORDER — LIDOCAINE 2% (20 MG/ML) 5 ML SYRINGE
INTRAMUSCULAR | Status: DC | PRN
  Administered 2021-11-11: 60 mg via INTRAVENOUS

## 2021-11-11 MED ORDER — BRIMONIDINE TARTRATE 0.2 % OP SOLN
OPHTHALMIC | Status: AC
  Filled 2021-11-11: qty 5

## 2021-11-11 MED ORDER — DORZOLAMIDE HCL-TIMOLOL MAL 2-0.5 % OP SOLN
OPHTHALMIC | Status: DC | PRN
  Administered 2021-11-11: 1 [drp] via OPHTHALMIC

## 2021-11-11 MED ORDER — MIDAZOLAM HCL 2 MG/2ML IJ SOLN
INTRAMUSCULAR | Status: AC
  Filled 2021-11-11: qty 2

## 2021-11-11 MED ORDER — HYDROCODONE-ACETAMINOPHEN 5-325 MG PO TABS: 1.0000 | ORAL_TABLET | ORAL | 0 refills | Status: AC | PRN

## 2021-11-11 MED ORDER — DEXAMETHASONE SODIUM PHOSPHATE 10 MG/ML IJ SOLN
INTRAMUSCULAR | Status: DC | PRN
  Administered 2021-11-11: 10 mg via INTRAVENOUS

## 2021-11-11 MED ORDER — POLYMYXIN B SULFATE 500000 UNITS IJ SOLR
INTRAMUSCULAR | Status: AC
  Filled 2021-11-11: qty 10

## 2021-11-11 MED ORDER — INDOCYANINE GREEN 25 MG IV SOLR
INTRAVENOUS | Status: AC
  Filled 2021-11-11: qty 10

## 2021-11-11 MED ORDER — ONDANSETRON HCL 4 MG/2ML IJ SOLN
INTRAMUSCULAR | Status: DC | PRN
  Administered 2021-11-11: 4 mg via INTRAVENOUS

## 2021-11-11 MED ORDER — FENTANYL CITRATE (PF) 250 MCG/5ML IJ SOLN
INTRAMUSCULAR | Status: DC | PRN
  Administered 2021-11-11 (×3): 50 ug via INTRAVENOUS
  Administered 2021-11-11: 100 ug via INTRAVENOUS

## 2021-11-11 MED ORDER — LIDOCAINE HCL (PF) 4 % IJ SOLN
INTRAMUSCULAR | Status: AC
  Filled 2021-11-11: qty 5

## 2021-11-11 MED ORDER — FENTANYL CITRATE (PF) 250 MCG/5ML IJ SOLN
INTRAMUSCULAR | Status: AC
Start: 2021-11-11 — End: ?
  Filled 2021-11-11: qty 5

## 2021-11-11 MED ORDER — ACETAMINOPHEN 500 MG PO TABS: 1000.0000 mg | ORAL_TABLET | Freq: Once | ORAL | Status: AC

## 2021-11-11 MED ORDER — TROPICAMIDE 1 % OP SOLN
1.0000 [drp] | OPHTHALMIC | Status: AC | PRN
  Administered 2021-11-11 (×3): 1 [drp] via OPHTHALMIC
  Filled 2021-11-11: qty 15

## 2021-11-11 MED ORDER — NA CHONDROIT SULF-NA HYALURON 40-30 MG/ML IO SOSY
INTRAOCULAR | Status: DC | PRN
Start: 2021-11-11 — End: 2021-11-11
  Administered 2021-11-11: 0.5 mL via INTRAOCULAR

## 2021-11-11 MED ORDER — LIDOCAINE HCL (PF) 2 % IJ SOLN
INTRAMUSCULAR | Status: AC
  Filled 2021-11-11: qty 10

## 2021-11-11 MED ORDER — TOBRAMYCIN-DEXAMETHASONE 0.3-0.1 % OP OINT
TOPICAL_OINTMENT | OPHTHALMIC | Status: AC
  Filled 2021-11-11: qty 3.5

## 2021-11-11 MED ORDER — PROPARACAINE HCL 0.5 % OP SOLN
1.0000 [drp] | OPHTHALMIC | Status: AC | PRN
  Administered 2021-11-11 (×3): 1 [drp] via OPHTHALMIC
  Filled 2021-11-11: qty 15

## 2021-11-11 MED ORDER — ATROPINE SULFATE 1 % OP SOLN
OPHTHALMIC | Status: AC
  Filled 2021-11-11: qty 5

## 2021-11-11 MED ORDER — CARBACHOL 0.01 % IO SOLN
INTRAOCULAR | Status: AC
  Filled 2021-11-11: qty 1.5

## 2021-11-11 MED ORDER — PROPOFOL 10 MG/ML IV BOLUS
INTRAVENOUS | Status: DC | PRN
Start: 2021-11-11 — End: 2021-11-11
  Administered 2021-11-11: 150 mg via INTRAVENOUS

## 2021-11-11 MED ORDER — SODIUM CHLORIDE 0.9 % IV SOLN: INTRAVENOUS | Status: DC

## 2021-11-11 MED ORDER — MIDAZOLAM HCL 2 MG/2ML IJ SOLN
INTRAMUSCULAR | Status: DC | PRN
  Administered 2021-11-11: 2 mg via INTRAVENOUS

## 2021-11-11 MED ORDER — ONDANSETRON HCL 4 MG/2ML IJ SOLN
INTRAMUSCULAR | Status: AC
  Filled 2021-11-11: qty 2

## 2021-11-11 MED ORDER — GATIFLOXACIN 0.5 % OP SOLN
OPHTHALMIC | Status: AC
  Filled 2021-11-11: qty 2.5

## 2021-11-11 MED ORDER — BACITRACIN-POLYMYXIN B 500-10000 UNIT/GM OP OINT
TOPICAL_OINTMENT | OPHTHALMIC | Status: AC
  Filled 2021-11-11: qty 3.5

## 2021-11-11 MED ORDER — SUGAMMADEX SODIUM 200 MG/2ML IV SOLN
INTRAVENOUS | Status: DC | PRN
  Administered 2021-11-11: 200 mg via INTRAVENOUS

## 2021-11-11 MED ORDER — PHENYLEPHRINE 40 MCG/ML (10ML) SYRINGE FOR IV PUSH (FOR BLOOD PRESSURE SUPPORT)
PREFILLED_SYRINGE | INTRAVENOUS | Status: DC | PRN
Start: 2021-11-11 — End: 2021-11-11
  Administered 2021-11-11 (×2): 80 ug via INTRAVENOUS

## 2021-11-11 MED ORDER — DEXAMETHASONE SODIUM PHOSPHATE 10 MG/ML IJ SOLN
INTRAMUSCULAR | Status: AC
  Filled 2021-11-11: qty 1

## 2021-11-11 MED ORDER — CEFTAZIDIME 1 G IJ SOLR
INTRAMUSCULAR | Status: AC
  Filled 2021-11-11: qty 1

## 2021-11-11 MED ORDER — DORZOLAMIDE HCL-TIMOLOL MAL 2-0.5 % OP SOLN
OPHTHALMIC | Status: AC
  Filled 2021-11-11: qty 10

## 2021-11-11 MED ORDER — LIDOCAINE HCL (PF) 1 % IJ SOLN
INTRAMUSCULAR | Status: AC
  Filled 2021-11-11: qty 5

## 2021-11-11 MED ORDER — ORAL CARE MOUTH RINSE: 15.0000 mL | Freq: Once | OROMUCOSAL | Status: AC

## 2021-11-11 MED ORDER — NA CHONDROIT SULF-NA HYALURON 40-30 MG/ML IO SOSY
INTRAOCULAR | Status: AC
  Filled 2021-11-11: qty 0.5

## 2021-11-11 MED ORDER — ATROPINE SULFATE 1 % OP SOLN
1.0000 [drp] | OPHTHALMIC | Status: AC | PRN
  Administered 2021-11-11 (×3): 1 [drp] via OPHTHALMIC
  Filled 2021-11-11: qty 5

## 2021-11-11 MED ORDER — ACETAMINOPHEN 500 MG PO TABS
ORAL_TABLET | ORAL | Status: AC
  Administered 2021-11-11: 1000 mg via ORAL
  Filled 2021-11-11: qty 2

## 2021-11-11 MED ORDER — ACETAZOLAMIDE SODIUM 500 MG IJ SOLR
INTRAMUSCULAR | Status: AC
  Filled 2021-11-11: qty 500

## 2021-11-11 MED ORDER — BUPIVACAINE HCL (PF) 0.75 % IJ SOLN
INTRAMUSCULAR | Status: DC | PRN
  Administered 2021-11-11: 5 mL

## 2021-11-11 MED ORDER — PROPOFOL 10 MG/ML IV BOLUS
INTRAVENOUS | Status: AC
  Filled 2021-11-11: qty 20

## 2021-11-11 SURGICAL SUPPLY — 43 items
APPLICATOR COTTON TIP 6 STRL (MISCELLANEOUS) ×8 IMPLANT
APPLICATOR COTTON TIP 6IN STRL (MISCELLANEOUS) ×12
BAND SCLERAL BUCKLING TYPE 41 (Ophthalmic Related) ×1 IMPLANT
BETADINE 5% OPHTHALMIC (OPHTHALMIC) ×4 IMPLANT
CABLE BIPOLOR RESECTION CORD (MISCELLANEOUS) ×1 IMPLANT
CANNULA DUALBORE 25G (CANNULA) ×1 IMPLANT
CANNULA FLEX TIP 25G (CANNULA) ×3 IMPLANT
COVER SURGICAL LIGHT HANDLE (MISCELLANEOUS) ×3 IMPLANT
DRAPE MICROSCOPE LEICA 46X105 (MISCELLANEOUS) ×3 IMPLANT
GAS AUTO FILL CONSTEL (OPHTHALMIC) ×3
GAS AUTO FILL CONSTELLATION (OPHTHALMIC) IMPLANT
GOWN STRL REUS W/ TWL LRG LVL3 (GOWN DISPOSABLE) ×4 IMPLANT
GOWN STRL REUS W/TWL LRG LVL3 (GOWN DISPOSABLE) ×6
KIT BASIN OR (CUSTOM PROCEDURE TRAY) ×3 IMPLANT
KIT PERFLUORON PROCEDURE 5ML (MISCELLANEOUS) ×1 IMPLANT
KNIFE GRIESHABER SHARP 2.5MM (MISCELLANEOUS) ×3 IMPLANT
NDL 18GX1X1/2 (RX/OR ONLY) (NEEDLE) ×2 IMPLANT
NDL 25GX 5/8IN NON SAFETY (NEEDLE) ×8 IMPLANT
NDL HYPO 30X.5 LL (NEEDLE) ×4 IMPLANT
NEEDLE 18GX1X1/2 (RX/OR ONLY) (NEEDLE) ×6 IMPLANT
NEEDLE 25GX 5/8IN NON SAFETY (NEEDLE) ×12 IMPLANT
NEEDLE HYPO 30X.5 LL (NEEDLE) ×6 IMPLANT
OPHTHALMIC BETADINE 5% (OPHTHALMIC) ×6
PACK VITRECTOMY CUSTOM (CUSTOM PROCEDURE TRAY) ×3 IMPLANT
PAD ARMBOARD 7.5X6 YLW CONV (MISCELLANEOUS) ×6 IMPLANT
PAK PIK VITRECTOMY CVS 25GA (OPHTHALMIC) ×3 IMPLANT
PROBE ENDO DIATHERMY 25G (MISCELLANEOUS) ×1 IMPLANT
PROBE LASER ILLUM FLEX CVD 25G (OPHTHALMIC) ×1 IMPLANT
SLEEVE SCLERAL BUCK TYPE 70 (Ophthalmic Related) ×3 IMPLANT
SUT ETHILON 5.0 S-24 (SUTURE) ×3 IMPLANT
SUT SILK 2 0 (SUTURE)
SUT SILK 2 0 TIES 17X18 (SUTURE) ×3
SUT SILK 2-0 18XBRD TIE 12 (SUTURE) ×2 IMPLANT
SUT SILK 2-0 18XBRD TIE BLK (SUTURE) ×2 IMPLANT
SUT VICRYL 7 0 TG140 8 (SUTURE) ×3 IMPLANT
SYR 10ML LL (SYRINGE) ×4 IMPLANT
SYR 20ML LL LF (SYRINGE) ×3 IMPLANT
SYR 5ML LL (SYRINGE) ×3 IMPLANT
SYR BULB EAR ULCER 3OZ GRN STR (SYRINGE) ×3 IMPLANT
SYR TB 1ML LUER SLIP (SYRINGE) ×4 IMPLANT
TOWEL GREEN STERILE FF (TOWEL DISPOSABLE) ×3 IMPLANT
TRAY FOLEY W/BAG SLVR 14FR (SET/KITS/TRAYS/PACK) ×2 IMPLANT
WATER STERILE IRR 1000ML POUR (IV SOLUTION) ×3 IMPLANT

## 2021-11-11 NOTE — Interval H&P Note (Signed)
History and Physical Interval Note: ? ?11/11/2021 ?3:30 PM ? ?Cristian Mcgee  has presented today for surgery, with the diagnosis of right eye, retinal detachment.  The various methods of treatment have been discussed with the patient and family. After consideration of risks, benefits and other options for treatment, the patient has consented to  Procedure(s): ?VITRECTOMY 25 GAUGE WITH SCLERAL BUCKLE (Right) as a surgical intervention.  The patient's history has been reviewed, patient examined, no change in status, stable for surgery.  I have reviewed the patient's chart and labs.  Questions were answered to the patient's satisfaction.   ? ? ?Bernarda Caffey ? ? ?

## 2021-11-11 NOTE — Brief Op Note (Signed)
11/11/2021 ? ?7:14 PM ? ?PATIENT:  JEFREY RABURN  61 y.o. male ? ?PRE-OPERATIVE DIAGNOSIS:  right eye, retinal detachment ? ?POST-OPERATIVE DIAGNOSIS:  right eye, retinal detachment ? ?PROCEDURE:  Procedure(s) with comments: ?VITRECTOMY 25 GAUGE WITH SCLERAL BUCKLE (Right) ?PHOTOCOAGULATION WITH LASER (Right) ?GAS/FLUID EXCHANGE (Right) ?INSERTION OF GAS - C3F8 ? ?SURGEON:  Surgeon(s) and Role: ?   Bernarda Caffey, MD - Primary ? ?ASSISTANTS: Ernest Mallick, Ophthalmic Assistant   ? ?ANESTHESIA:   local and general ? ?EBL:  10 mL  ? ?BLOOD ADMINISTERED:none ? ?DRAINS: none  ? ?LOCAL MEDICATIONS USED:  BUPIVICAINE , LIDOCAINE , and Amount: 10 ml ? ?SPECIMEN:  No Specimen ? ?DISPOSITION OF SPECIMEN:  N/A ? ?COUNTS:  YES ? ?TOURNIQUET:  * No tourniquets in log * ? ?DICTATION: .Note written in EPIC ? ?PLAN OF CARE: Discharge to home after PACU ? ?PATIENT DISPOSITION:  PACU - hemodynamically stable. ?  ?Delay start of Pharmacological VTE agent (>24hrs) due to surgical blood loss or risk of bleeding: not applicable ? ?

## 2021-11-11 NOTE — Discharge Instructions (Addendum)
POSTOPERATIVE INSTRUCTIONS  Your doctor has performed vitreoretinal surgery on you at [place].  - Keep eye patched and shielded until seen by Dr. Finn Amos 8 AM tomorrow in clinic - Do not use drops until return - FACE DOWN POSITIONING WHILE AWAKE - Sleep with belly down or on left side, avoid laying flat on back.    - No strenuous bending, stooping or lifting.  - You may not drive until further notice.  - If your doctor used a gas bubble in your eye during the procedure he will advise you on postoperative positioning. If you have a gas bubble you will be wearing a green bracelet that was applied in the operating room. The green bracelet should stay on as long as the gas bubble is in your eye. While the gas bubble is present you should not fly in an airplane. If you require general anesthesia while the gas bubble is present you must notify your anesthesiologist that an intraocular gas bubble is present so he can take the appropriate precautions.  - Tylenol or any other over-the-counter pain reliever can be used according to your doctor. If more pain medicine is required, your doctor will have a prescription for you.  - You may read, go up and down stairs, and watch television.     Shandel Busic, M.D., Ph.D.  

## 2021-11-11 NOTE — Anesthesia Procedure Notes (Signed)
Procedure Name: Intubation ?Date/Time: 11/11/2021 3:52 PM ?Performed by: Reece Agar, CRNA ?Pre-anesthesia Checklist: Patient identified, Emergency Drugs available, Suction available and Patient being monitored ?Patient Re-evaluated:Patient Re-evaluated prior to induction ?Oxygen Delivery Method: Circle System Utilized ?Preoxygenation: Pre-oxygenation with 100% oxygen ?Induction Type: IV induction ?Ventilation: Mask ventilation without difficulty and Oral airway inserted - appropriate to patient size ?Laryngoscope Size: Mac and 4 ?Grade View: Grade I ?Tube type: Oral ?Tube size: 7.5 mm ?Number of attempts: 1 ?Airway Equipment and Method: Stylet and Oral airway ?Placement Confirmation: ETT inserted through vocal cords under direct vision, positive ETCO2 and breath sounds checked- equal and bilateral ?Secured at: 23 cm ?Tube secured with: Tape ?Dental Injury: Teeth and Oropharynx as per pre-operative assessment  ? ? ? ? ?

## 2021-11-11 NOTE — Progress Notes (Signed)
?[facility] Note ? ?11/12/2021 ?  ? ?CHIEF COMPLAINT ?Patient presents for Post-op Follow-up ? ? ?HISTORY OF PRESENT ILLNESS: ?Cristian Mcgee is a 61 y.o. male who presents to the clinic today for:  ? ?HPI   ? ? Post-op Follow-up   ?In right eye.  Discomfort includes pain.  I, the attending physician,  performed the HPI with the patient and updated documentation appropriately. ? ?  ?  ? ? Comments   ?Patient didn't sleep because of his head positioning.  Pain last night.  ? ?  ?  ?Last edited by Bernarda Caffey, MD on 11/15/2021  1:36 PM.  ?  ? ? ?Referring physician: ?Rankins, Bill Salinas, MD ?[place] ?[place],  [place] [address] ? ?HISTORICAL INFORMATION:  ? ?Selected notes from the [facility] ?Referred by Dr. Anthony Sar for RD OD ?LEE:  ?Ocular Hx- ?PMH- ?  ? ?CURRENT MEDICATIONS: ?No current outpatient medications on file. (Ophthalmic Drugs)  ? ?No current facility-administered medications for this visit. (Ophthalmic Drugs)  ? ?Current Outpatient Medications (Other)  ?Medication Sig  ? allopurinol (ZYLOPRIM) 100 MG tablet Take 100 mg by mouth daily.  ? HYDROcodone-acetaminophen (NORCO/VICODIN) 5-325 MG tablet Take 1 tablet by mouth every 4 (four) hours as needed for moderate pain.  ? olmesartan (BENICAR) 20 MG tablet Take 20 mg by mouth daily.  ? ?No current facility-administered medications for this visit. (Other)  ? ?REVIEW OF SYSTEMS: ?ROS   ?Positive for: Eyes ?Negative for: Constitutional, Gastrointestinal, Neurological, Skin, Genitourinary, Musculoskeletal, HENT, Endocrine, Cardiovascular, Respiratory, Psychiatric, Allergic/Imm, Heme/Lymph ?Last edited by Leonie Douglas, COA on 11/12/2021  8:04 AM.  ?  ? ?ALLERGIES ?No Known Allergies ? ?PAST MEDICAL HISTORY ?Past Medical History:  ?Diagnosis Date  ? Hypertension   ? Prostate cancer ([place])   ? ?Past Surgical History:  ?Procedure Laterality Date  ? BICEPS TENDON REPAIR Right   ? "reattach bicep on right arm"- 18 years ago  per patient (11/10/21)  ? CATARACT EXTRACTION Right   ? FRACTURE SURGERY Left   ? 35 years ago  ? GAS INSERTION  11/11/2021  ? Procedure: INSERTION OF GAS;  Surgeon: Bernarda Caffey, MD;  Location: [facility];  Service: Ophthalmology;;  C3F8  ? GAS/FLUID EXCHANGE Right 11/11/2021  ? Procedure: GAS/FLUID EXCHANGE;  Surgeon: Bernarda Caffey, MD;  Location: [facility];  Service: Ophthalmology;  Laterality: Right;  ? PHOTOCOAGULATION WITH LASER Right 11/11/2021  ? Procedure: PHOTOCOAGULATION WITH LASER;  Surgeon: Bernarda Caffey, MD;  Location: [facility];  Service: Ophthalmology;  Laterality: Right;  ? TESTICLE REMOVAL    ? as a child had 1 testicle removed  ? VITRECTOMY 25 GAUGE WITH SCLERAL BUCKLE Right 11/11/2021  ? Procedure: VITRECTOMY 25 GAUGE WITH SCLERAL BUCKLE;  Surgeon: Bernarda Caffey, MD;  Location: [facility];  Service: Ophthalmology;  Laterality: Right;  ? ?FAMILY HISTORY ?Family History  ?Problem Relation Age of Onset  ? Retinal detachment Mother   ? Diabetes Father   ? ?SOCIAL HISTORY ?Social History  ? ?Tobacco Use  ? Smoking status: Never  ? Smokeless tobacco: Current  ?  Types: Chew  ?Vaping Use  ? Vaping Use: Never used  ?Substance Use Topics  ? Alcohol use: Yes  ?  Alcohol/week: 12.0 standard drinks  ?  Types: 12 Cans of beer per week  ? Drug use: Never  ?  ? ?  ?OPHTHALMIC EXAM: ? ?Base Eye Exam   ? ? Visual Acuity (Snellen - Linear)   ? ?  Right Left  ? Dist [place] HM 6"   ? ?  ?  ? ? Tonometry (Tonopen, 8:09 AM)   ? ?   Right Left  ? Pressure 23   ? ?  ?  ? ? Pupils   ? ?   Shape  ? Right Dilated  ? Left   ? ?  ?  ? ? Neuro/Psych   ? ? Oriented x3: Yes  ? Mood/Affect: Normal  ? ?  ?  ? ? Dilation   ? ? Both eyes: dilated @ 8:10 AM  ? ?  ?  ? ?  ? ?Slit Lamp and Fundus Exam   ? ? Slit Lamp Exam   ? ?   Right Left  ? Lids/Lashes Dermatochalasis - upper lid, mild edema Dermatochalasis - upper lid  ? Conjunctiva/Sclera Subconjunctival hemorrhage, nasal and temporal pinguecula, Suture intact Trace Injection, nasal and temporal  pinguecula  ? Cornea central epi defect, endo heme inferiorly, Descemet's folds mild tear film debris  ? Anterior Chamber Deep with early fibrin reaction centrally, 2+ cell Deep and quiet  ? Iris Round and moderately dilated Round and dilated  ? Lens PC IOL in good position 2+ Nuclear sclerosis, 2+ Cortical cataract  ? Anterior Vitreous Vitreous syneresis, vitreous condensations / debris Vitreous syneresis  ? ?  ?  ? ? Fundus Exam   ? ?   Right Left  ? Disc Pink and Sharp Pink and Sharp, Compact  ? C/D Ratio 0.2 0.2  ? Macula Flat under gas Flat, Good foveal reflex, RPE mottling, No heme or edema  ? Vessels attenuated, Tortuous Mild copper wiring  ? Periphery Originally: Bullous ST detachment from 0900-1200 extending posteriorly into macula, no obvious RT on depression, paving stone degeneration inferiorly -- Periphery attached over buckle, good buckle height, good laser over buckle, good view Attached, mild pavingstone degeneration inferiorly  ? ?  ?  ? ?  ? ? ?IMAGING AND PROCEDURES  ?Imaging and Procedures for 11/12/2021 ? ? ? ? ?  ?  ? ?  ?ASSESSMENT/PLAN: ? ?  ICD-10-CM   ?1. Right retinal detachment  H33.21   ?  ?2. Essential hypertension  I10   ?  ?3. Hypertensive retinopathy of both eyes  H35.033   ?  ?4. Pseudophakia  Z96.1   ?  ?5. Combined forms of age-related cataract of left eye  H25.812   ?  ? ?Rhegmatogenous retinal detachment, OD ?- bullous superior mac off detachment, onset of foveal involvement 11/08/2021 by history ?- detached from 0900 to 12 oclock ?- Pt reports +family history of RD in mother ?- POD1 s/p SBP + PPV/PFO/EL/FAX/14% C3F8 OD, 03.30.2023 ?            - doing well this morning ?            - retina attached and in good position -- good buckle height and laser around breaks ?            - IOP mildly elevated, 24 ?            - start   PF 6x/day OD  ?                        zymaxid QID OD  ?                        Atropine BID OD   ?  Cosopt BID OD ?                         PSO ung QID OD and prn ?            - cont face down positioning x3 days; avoid laying flat on back  ?            - eye shield when sleeping  ?            - post op drop and positioning instructions reviewed  ?            - tylenol/ibuprofen for pain  ?            - Rx given for breakthrough pain ? - f/u 1 week, DFE ? ?2,3. Hypertensive retinopathy OU ?- discussed importance of tight BP control ?- monitor ? ?4. Pseudophakia OD ? - s/p CE/IOL w/ Dr. Alanda Slim ? - IOL in good position, doing well ? - monitor  ? ?5. Mixed Cataract OS ?- The symptoms of cataract, surgical options, and treatments and risks were discussed with patient. ?- discussed diagnosis and progression ?- monitor for now  ? ?Ophthalmic Meds Ordered this visit:  ?No orders of the defined types were placed in this encounter. ?  ? ?Return in about 1 week (around 11/19/2021) for DFE. ? ?There are no Patient Instructions on file for this visit. ? ? ?Explained the diagnoses, plan, and follow up with the patient and they expressed understanding.  Patient expressed understanding of the importance of proper follow up care.  ? ?This document serves as a record of services personally performed by Gardiner Sleeper, MD, PhD. It was created on their behalf by San Jetty. Owens Shark, OA an ophthalmic technician. The creation of this record is the provider's dictation and/or activities during the visit.   ? ?Electronically signed by: San Jetty. Owens Shark, [place] 03.30.2023 1:38 PM ? ?This document serves as a record of services personally performed by Gardiner Sleeper, MD, PhD. It was created on their behalf by Leonie Douglas, an ophthalmic technician. The creation of this record is the provider's dictation and/or activities during the visit.   ? ?Electronically signed by: Leonie Douglas COA, 11/15/21  1:38 PM ? ?Gardiner Sleeper, M.D., Ph.D. ?Diseases & Surgery of the Retina and Vitreous ?[place] ? ?I have reviewed the above documentation for accuracy and  completeness, and I agree with the above. Gardiner Sleeper, M.D., Ph.D. 11/15/21 1:38 PM ? ?Abbreviations: ?M myopia (nearsighted); A astigmatism; H hyperopia (farsighted); P presbyopia; Mrx spectacle prescripti

## 2021-11-11 NOTE — Op Note (Signed)
Date of procedure: 03.30.23 ?  ?Surgeon: Bernarda Caffey, MD, PhD ?  ?Assistant: Ernest Mallick, Ophthalmic Assistant  ?  ?Pre-operative Diagnosis: Macula involving Rhegmatogenous Retinal Detachment, Right Eye ?  ?Post-operative diagnosis: Macula involving Rhegmatogenous Retinal Detachment, Right Eye ?  ?Anesthesia: GETA ?  ?Procedures: ?1)     Scleral Buckle, Right Eye ?2)     25 gauge pars plana vitrectomy, Right Eye CPT [phone] ?3)     Perfluorocarbon injection ?4)     Fluid-air exchange, Right Eye ?5)     Endolaser, Right Eye ?5)     Injection of 14% C3F8 gas ?  ?Complications: none ?Estimated blood loss: minimal ?Specimens: none ?  ?Brief history:   ?The patient has a history of decreased vision in the affected right eye, and on examination, was noted to have a macula-involving retinal detachment, affecting activities of daily living. The risks, benefits, and alternatives were explained to the patient, including pain, bleeding, infection, loss of vision, double vision, droopy eyelids, and need for more surgeries.  Informed consent was obtained from the patient and placed in the chart.   ?  ?Procedure: ?            The patient was brought to the preoperative holding area where the correct eye was confirmed and marked.  The patient was then brought to the operating room where general endotracheal anesthesia was induced. A secondary time-out was performed to identify the correct patient, eye, procedures, and any allergies. The right eye was prepped and draped in the usual sterile ophthalmic fashion followed by placement of a lid speculum. ?            A 360? conjunctival peritomy was created using Westcott scissors and 0.12 forceps. Each of the four quadrants between the rectus muscles was dissected using Stevens scissors to detach Tenon?s attachments from the globe. Each of the four rectus muscles was isolated on a muscle hook and slung using 2-0 Silk suture in the usual standard fashion. Each of the four quadrants  between the rectus muscles was inspected and there were noted to be no areas of scleral thinning. A #10 silicone band was then brought onto the field and was threaded under each rectus muscle. The band was then loosely secured using a #70 Watzke sleeve in the superotemporal quadrant. The band was then sutured to the sclera in each quadrant using 5-0 nylon sutures passed partial thickness through the sclera in a horizontal mattress fashion. The scleral buckle was then tightened to the appropriate height with two locking needle drivers. Attention was then turned to the vitrectomy portion of the procedure.  ?            A 25 gauge trocar was placed in the inferotemporal quadrant in a beveled fashion. A 4 mm infusion cannula was placed through this trocar, and the infusion cannula was confirmed in the vitreous cavity with no incarceration of retina or choroid prior to turning it on. Two additional 25 gauge trocars were placed in the superonasal and superotemporal quadrants (2 and 10 oclock, respectively) in a similar beveled fashion. At this time, a standard three-port pars plana vitrectomy was performed using the light pipe, the cutter, and the BIOM viewing system. A thorough anterior, core and peripheral vitreous dissection was performed. A posterior vitreous detachment was confirmed over the optic nerve. There was a bullous superior/temporal retinal detachment extending from 0800 to 1230 w/ macular involvement. The fovea was obscured by the bullous, detached retina. There were three,  very peripheral retinal breaks within the detached retina spanning 10-11 oclock. ?            Traction was removed from all retinal breaks. The breaks were trimmed using the cutter to smooth the edges. Perfluoron was injected to push the subretinal fluid anterior to the scleral buckle.  A complete fluid-air exchange was performed with a soft tip extrusion cannula over the most posterior retinal break, then posteriorly to remove the  perfluoron. After completion of these maneuvers, the retina was flat over the macula and over the scleral buckle. Under air, endolaser was applied to all the breaks and over and posterior to the scleral buckle. ?            At this time, the buckle height was confirmed and the buckle was finalized by trimming the band ends. The superotemporal trocar was removed and sutured with 7-0 vicryl in an interrupted fashion.  A complete air to 14% C3F8 gas exchange was performed through the infusion cannula and vented through the superonasal trocar using the extrusion cannula. The superonasal trocar and infusion cannula and associated trocar were then removed and sutured with 7-0 vicryl in an interrupted fashion. Kefzol + polymixin irrigation was then used over the buckle. A subtenon's block containing 0.75% marcaine and 2% lidocaine was administered.  ?            The conjunctiva was closed with 7-0 vicryl sutures. The eye's intraocular pressure was confirmed to be at a physiologic level by digital palpation. Subconjunctival injections of Antibiotic and kenalog were administered. The lid speculum and drapes were removed. Drops of an antibiotic, antihypertensives, and steroid were given. Copious antibiotic ointment was instilled into the eye. The eye was patched and shielded. The patient tolerated the procedure well without any intraoperative or immediate postoperative complications. The patient was taken to the recovery room in good condition. The patient was instructed to maintain a strict face-down position and will be seen by Dr. Coralyn Pear tomorrow morning in clinic. ? ?

## 2021-11-11 NOTE — Transfer of Care (Signed)
Immediate Anesthesia Transfer of Care Note ? ?Patient: Cristian Mcgee ? ?Procedure(s) Performed: VITRECTOMY 25 GAUGE WITH SCLERAL BUCKLE (Right: Eye) ?PHOTOCOAGULATION WITH LASER (Right: Eye) ?GAS/FLUID EXCHANGE (Right: Eye) ?INSERTION OF GAS (Eye) ? ?Patient Location: PACU ? ?Anesthesia Type:General ? ?Level of Consciousness: drowsy ? ?Airway & Oxygen Therapy: Patient Spontanous Breathing and Patient connected to face mask oxygen ? ?Post-op Assessment: Report given to RN and Post -op Vital signs reviewed and stable ? ?Post vital signs: Reviewed and stable ? ?Last Vitals:  ?Vitals Value Taken Time  ?BP 130/68 11/11/21 1913  ?Temp    ?Pulse 81 11/11/21 1914  ?Resp 13 11/11/21 1914  ?SpO2 96 % 11/11/21 1914  ?Vitals shown include unvalidated device data. ? ?Last Pain:  ?Vitals:  ? 11/11/21 1315  ?PainSc: 0-No pain  ?   ? ?  ? ?Complications: No notable events documented. ?

## 2021-11-11 NOTE — Anesthesia Postprocedure Evaluation (Signed)
Anesthesia Post Note ? ?Patient: Cristian Mcgee ? ?Procedure(s) Performed: VITRECTOMY 25 GAUGE WITH SCLERAL BUCKLE (Right: Eye) ?PHOTOCOAGULATION WITH LASER (Right: Eye) ?GAS/FLUID EXCHANGE (Right: Eye) ?INSERTION OF GAS (Eye) ? ?  ? ?Patient location during evaluation: PACU ?Anesthesia Type: General ?Level of consciousness: sedated and patient cooperative ?Pain management: pain level controlled ?Vital Signs Assessment: post-procedure vital signs reviewed and stable ?Respiratory status: spontaneous breathing ?Cardiovascular status: stable ?Anesthetic complications: no ? ? ?No notable events documented. ? ?Last Vitals:  ?Vitals:  ? 11/11/21 1945 11/11/21 2000  ?BP: 123/72 123/67  ?Pulse: 71 67  ?Resp: 10 12  ?Temp:  36.9 ?C  ?SpO2: 95% 96%  ?  ?Last Pain:  ?Vitals:  ? 11/11/21 2000  ?PainSc: 0-No pain  ? ? ?  ?  ?  ?  ?  ?  ? ?Nolon Nations ? ? ? ? ?

## 2021-11-11 NOTE — Progress Notes (Signed)
Called to request labs and pre-op surgical clearance note from Dr. Dahlia Bailiff office.  Spoke with [employer].  She is forwarding my request to medical records to have them fax those results. ?

## 2021-11-12 ENCOUNTER — Ambulatory Visit (INDEPENDENT_AMBULATORY_CARE_PROVIDER_SITE_OTHER): Payer: Federal, State, Local not specified - PPO | Admitting: Ophthalmology

## 2021-11-12 ENCOUNTER — Encounter (INDEPENDENT_AMBULATORY_CARE_PROVIDER_SITE_OTHER): Payer: Self-pay | Admitting: Ophthalmology

## 2021-11-12 DIAGNOSIS — H25812 Combined forms of age-related cataract, left eye: Secondary | ICD-10-CM

## 2021-11-12 DIAGNOSIS — H35033 Hypertensive retinopathy, bilateral: Secondary | ICD-10-CM

## 2021-11-12 DIAGNOSIS — H3321 Serous retinal detachment, right eye: Secondary | ICD-10-CM

## 2021-11-12 DIAGNOSIS — I10 Essential (primary) hypertension: Secondary | ICD-10-CM

## 2021-11-12 DIAGNOSIS — Z961 Presence of intraocular lens: Secondary | ICD-10-CM

## 2021-11-15 ENCOUNTER — Encounter (INDEPENDENT_AMBULATORY_CARE_PROVIDER_SITE_OTHER): Payer: Self-pay | Admitting: Ophthalmology

## 2021-11-18 ENCOUNTER — Encounter (INDEPENDENT_AMBULATORY_CARE_PROVIDER_SITE_OTHER): Payer: Self-pay | Admitting: Ophthalmology

## 2021-11-18 ENCOUNTER — Ambulatory Visit (INDEPENDENT_AMBULATORY_CARE_PROVIDER_SITE_OTHER): Payer: Federal, State, Local not specified - PPO | Admitting: Ophthalmology

## 2021-11-18 DIAGNOSIS — Z961 Presence of intraocular lens: Secondary | ICD-10-CM

## 2021-11-18 DIAGNOSIS — H25812 Combined forms of age-related cataract, left eye: Secondary | ICD-10-CM

## 2021-11-18 DIAGNOSIS — H3321 Serous retinal detachment, right eye: Secondary | ICD-10-CM

## 2021-11-18 DIAGNOSIS — H35033 Hypertensive retinopathy, bilateral: Secondary | ICD-10-CM

## 2021-11-18 DIAGNOSIS — I10 Essential (primary) hypertension: Secondary | ICD-10-CM

## 2021-11-18 MED ORDER — BACITRACIN-POLYMYXIN B 500-10000 UNIT/GM OP OINT: 1.0000 "application " | TOPICAL_OINTMENT | Freq: Four times a day (QID) | OPHTHALMIC | 2 refills | Status: AC

## 2021-11-18 MED ORDER — PREDNISOLONE ACETATE 1 % OP SUSP: 1.0000 [drp] | Freq: Four times a day (QID) | OPHTHALMIC | 0 refills | Status: AC

## 2021-11-18 NOTE — Progress Notes (Addendum)
?[facility] Note ? ?11/18/2021 ?  ? ?CHIEF COMPLAINT ?Patient presents for Post-op Follow-up ? ? ?HISTORY OF PRESENT ILLNESS: ?Cristian Mcgee is a 61 y.o. male who presents to the clinic today for:  ? ?HPI   ? ? Post-op Follow-up   ?In right eye.  I, the attending physician,  performed the HPI with the patient and updated documentation appropriately. ? ?  ?  ? ? Comments   ?Patient is here for a 1 week post op visit. He states that he has been positioning as instructed. He has been doing the drops as instructed.  ?PF 6x/day OD, Zymaxid QID OD, Atropine BID OD, Cosopt BID OD, and PSO ung QID OD and prn. He last used all of his drops at 6 am.He feels that the vision has slightly improved things are not as dark.  ? ?  ?  ?Last edited by Bernarda Caffey, MD on 11/18/2021  8:15 AM.  ?  ?Pt did well this week. Maintained face down positioning "99% of time" States Cosopt burned initially -- improved now ? ?Referring physician: ?Rankins, Bill Salinas, MD ?[place] ?[place],  [place] [address] ? ?HISTORICAL INFORMATION:  ? ?Selected notes from the [facility] ?Referred by Dr. Anthony Sar for RD OD ?LEE:  ?Ocular Hx- ?PMH- ?  ? ?CURRENT MEDICATIONS: ?Current Outpatient Medications (Ophthalmic Drugs)  ?Medication Sig  ? atropine 1 % ophthalmic solution Place into the right eye 2 (two) times daily.  ? bacitracin-polymyxin b (POLYSPORIN) ophthalmic ointment Place 1 application. into the right eye 4 (four) times daily.  ? dorzolamide-timolol (COSOPT) 22.3-6.8 MG/ML ophthalmic solution Place 1 drop into the right eye 2 (two) times daily.  ? gatifloxacin (ZYMAXID) 0.5 % SOLN Place 1 drop into the right eye 4 (four) times daily.  ? prednisoLONE acetate (PRED FORTE) 1 % ophthalmic suspension Place 1 drop into the right eye 4 (four) times daily.  ? prednisoLONE acetate (PRED FORTE) 1 % ophthalmic suspension Place 1 drop into the right eye 4 (four) times daily.  ? ?No current facility-administered  medications for this visit. (Ophthalmic Drugs)  ? ?Current Outpatient Medications (Other)  ?Medication Sig  ? allopurinol (ZYLOPRIM) 100 MG tablet Take 100 mg by mouth daily.  ? HYDROcodone-acetaminophen (NORCO/VICODIN) 5-325 MG tablet Take 1 tablet by mouth every 4 (four) hours as needed for moderate pain.  ? olmesartan (BENICAR) 20 MG tablet Take 20 mg by mouth daily.  ? ?No current facility-administered medications for this visit. (Other)  ? ?REVIEW OF SYSTEMS: ?ROS   ?Positive for: Eyes ?Negative for: Constitutional, Gastrointestinal, Neurological, Skin, Genitourinary, Musculoskeletal, HENT, Endocrine, Cardiovascular, Respiratory, Psychiatric, Allergic/Imm, Heme/Lymph ?Last edited by Annie Paras, COT on 11/18/2021  7:58 AM.  ?  ? ?ALLERGIES ?No Known Allergies ? ?PAST MEDICAL HISTORY ?Past Medical History:  ?Diagnosis Date  ? Hypertension   ? Prostate cancer ([facility])   ? ?Past Surgical History:  ?Procedure Laterality Date  ? BICEPS TENDON REPAIR Right   ? "reattach bicep on right arm"- 18 years ago per patient (11/10/21)  ? CATARACT EXTRACTION Right   ? FRACTURE SURGERY Left   ? 35 years ago  ? GAS INSERTION  11/11/2021  ? Procedure: INSERTION OF GAS;  Surgeon: Bernarda Caffey, MD;  Location: [facility];  Service: Ophthalmology;;  C3F8  ? GAS/FLUID EXCHANGE Right 11/11/2021  ? Procedure: GAS/FLUID EXCHANGE;  Surgeon: Bernarda Caffey, MD;  Location: [facility];  Service: Ophthalmology;  Laterality: Right;  ?  PHOTOCOAGULATION WITH LASER Right 11/11/2021  ? Procedure: PHOTOCOAGULATION WITH LASER;  Surgeon: Bernarda Caffey, MD;  Location: [facility];  Service: Ophthalmology;  Laterality: Right;  ? TESTICLE REMOVAL    ? as a child had 1 testicle removed  ? VITRECTOMY 25 GAUGE WITH SCLERAL BUCKLE Right 11/11/2021  ? Procedure: VITRECTOMY 25 GAUGE WITH SCLERAL BUCKLE;  Surgeon: Bernarda Caffey, MD;  Location: [facility];  Service: Ophthalmology;  Laterality: Right;  ? ?FAMILY HISTORY ?Family History  ?Problem Relation Age of Onset  ? Retinal  detachment Mother   ? Diabetes Father   ? ?SOCIAL HISTORY ?Social History  ? ?Tobacco Use  ? Smoking status: Never  ? Smokeless tobacco: Current  ?  Types: Chew  ?Vaping Use  ? Vaping Use: Never used  ?Substance Use Topics  ? Alcohol use: Yes  ?  Alcohol/week: 12.0 standard drinks  ?  Types: 12 Cans of beer per week  ? Drug use: Never  ?  ? ?  ?OPHTHALMIC EXAM: ? ?Base Eye Exam   ? ? Visual Acuity (Snellen - Linear)   ? ?   Right Left  ? Dist [place] CF at 3' 20/25 -1  ? Dist ph [place] 20/250   ? ?  ?  ? ? Tonometry (Tonopen, 8:10 AM)   ? ?   Right Left  ? Pressure 12 13  ? ?  ?  ? ? Pupils   ? ?   Dark Shape React  ? Right Dilated Round Minimal  ? Left 2 Round   ? ?  ?  ? ? Visual Fields   ? ?   Left Right  ? Restrictions  Total superior nasal, inferior nasal deficiencies; Partial inner superior temporal, inferior temporal deficiencies  ? ?  ?  ? ? Extraocular Movement   ? ?   Right Left  ?  Full Full  ? ?  ?  ? ? Neuro/Psych   ? ? Oriented x3: Yes  ? Mood/Affect: Normal  ? ?  ?  ? ? Dilation   ? ? Right eye: 1.0% Mydriacyl, 2.5% Phenylephrine @ 8:16 AM  ? ?  ?  ? ?  ? ?Slit Lamp and Fundus Exam   ? ? Slit Lamp Exam   ? ?   Right Left  ? Lids/Lashes Dermatochalasis - upper lid, mild edema Dermatochalasis - upper lid  ? Conjunctiva/Sclera Subconjunctival hemorrhage improving, nasal and temporal pinguecula, Sutures intact Trace Injection, nasal and temporal pinguecula  ? Cornea central epi defect closed; 2+ PEE, endo pigment inferiorly (no heme), Descemet's folds improved mild tear film debris  ? Anterior Chamber Deep with fibrin reaction resolved, 2+ cell/pigment Deep and quiet  ? Iris Round and well dilated Round and dilated  ? Lens PC IOL in good position 2+ Nuclear sclerosis, 2+ Cortical cataract  ? Anterior Vitreous Post vitrectomy -- gas bubble 95% Vitreous syneresis  ? ?  ?  ? ? Fundus Exam   ? ?   Right Left  ? Disc Pink and Sharp Pink and Sharp, Compact  ? C/D Ratio 0.2 0.2  ? Macula Flat under gas Flat, Good foveal  reflex, RPE mottling, No heme or edema  ? Vessels attenuated, Tortuous Mild copper wiring  ? Periphery Retina attached over buckle, good buckle height, good laser changes over buckle Originally: Bullous ST detachment from 0900-1200 extending posteriorly into macula, no obvious RT on depression, paving stone degeneration inferiorly -- Attached, mild pavingstone degeneration inferiorly  ? ?  ?  ? ?  ? ? ?  IMAGING AND PROCEDURES  ?Imaging and Procedures for 11/18/2021 ? ? ? ? ?  ?  ? ?  ?ASSESSMENT/PLAN: ? ?  ICD-10-CM   ?1. Right retinal detachment  H33.21   ?  ?2. Essential hypertension  I10   ?  ?3. Hypertensive retinopathy of both eyes  H35.033   ?  ?4. Pseudophakia  Z96.1   ?  ?5. Combined forms of age-related cataract of left eye  H25.812   ?  ? ?Rhegmatogenous retinal detachment, OD ?- bullous superior mac off detachment, onset of foveal involvement 11/08/2021 by history ?- detached from 0900 to 12 oclock ?- Pt reports +family history of RD in mother ?- POW1 s/p SBP + PPV/PFO/EL/FAX/14% C3F8 OD, 03.30.2023 ?            - doing well this morning ?            - retina attached and in good position -- good buckle height and laser around breaks ?            - IOP 12 ?            - cont   PF 6x/day OD  ?                        zymaxid QID OD -- stop Monday ?                        Atropine BID OD -- stop ?                        Cosopt BID OD -- dec to qdaily ?                        PSO ung QID OD and prn ?            - cont face down positioning 30 min/hr; avoid laying flat on back  ?            - eye shield when sleeping  ?            - post op drop and positioning instructions reviewed  ?            - tylenol/ibuprofen for pain  ?            - Rx given for breakthrough pain ? - f/u 2-3 weeks, POV ? ?2,3. Hypertensive retinopathy OU ?- discussed importance of tight BP control ?- monitor ? ?4. Pseudophakia OD ? - s/p CE/IOL w/ Dr. Alanda Slim ? - IOL in good position, doing well ? - monitor  ? ?5. Mixed Cataract OS ?- The  symptoms of cataract, surgical options, and treatments and risks were discussed with patient. ?- discussed diagnosis and progression ?- monitor for now  ? ?Ophthalmic Meds Ordered this visit:  ?Meds ordered t

## 2021-11-29 NOTE — Progress Notes (Signed)
?[facility] Note ? ?12/02/2021 ?  ? ?CHIEF COMPLAINT ?Patient presents for Post-op Follow-up ? ? ?HISTORY OF PRESENT ILLNESS: ?Cristian Mcgee is a 61 y.o. male who presents to the clinic today for:  ? ?HPI   ? ? Post-op Follow-up   ?In right eye.  I, the attending physician,  performed the HPI with the patient and updated documentation appropriately. ? ?  ?  ? ? Comments   ?Patient states vision still blurred OD. No eye pain. ? ?  ?  ?Last edited by Bernarda Caffey, MD on 12/02/2021  2:04 PM.  ?  ?Pt states he can see a line in his vision where the bubble is ? ? ?Referring physician: ?Rankins, Bill Salinas, MD ?[address] ?[place],  [place] [address] ? ?HISTORICAL INFORMATION:  ? ?Selected notes from the [facility] ?Referred by Dr. Anthony Sar for RD OD ?LEE:  ?Ocular Hx- ?PMH- ?  ? ?CURRENT MEDICATIONS: ?Current Outpatient Medications (Ophthalmic Drugs)  ?Medication Sig  ? bacitracin-polymyxin b (POLYSPORIN) ophthalmic ointment Place 1 application. into the right eye 4 (four) times daily.  ? dorzolamide-timolol (COSOPT) 22.3-6.8 MG/ML ophthalmic solution Place 1 drop into the right eye daily.  ? prednisoLONE acetate (PRED FORTE) 1 % ophthalmic suspension Place 1 drop into the right eye 4 (four) times daily. (Patient taking differently: Place 1 drop into the right eye 6 (six) times daily.)  ? atropine 1 % ophthalmic solution Place into the right eye 2 (two) times daily. (Patient not taking: Reported on 12/02/2021)  ? gatifloxacin (ZYMAXID) 0.5 % SOLN Place 1 drop into the right eye 4 (four) times daily. (Patient not taking: Reported on 12/02/2021)  ? prednisoLONE acetate (PRED FORTE) 1 % ophthalmic suspension Place 1 drop into the right eye 4 (four) times daily.  ? ?No current facility-administered medications for this visit. (Ophthalmic Drugs)  ? ?Current Outpatient Medications (Other)  ?Medication Sig  ? allopurinol (ZYLOPRIM) 100 MG tablet Take 100 mg by mouth daily.  ?  HYDROcodone-acetaminophen (NORCO/VICODIN) 5-325 MG tablet Take 1 tablet by mouth every 4 (four) hours as needed for moderate pain.  ? olmesartan (BENICAR) 20 MG tablet Take 20 mg by mouth daily.  ? ?No current facility-administered medications for this visit. (Other)  ? ?REVIEW OF SYSTEMS: ?ROS   ?Positive for: Eyes ?Negative for: Constitutional, Gastrointestinal, Neurological, Skin, Genitourinary, Musculoskeletal, HENT, Endocrine, Cardiovascular, Respiratory, Psychiatric, Allergic/Imm, Heme/Lymph ?Last edited by Roselee Nova D, COT on 12/02/2021  8:22 AM.  ?  ? ?ALLERGIES ?No Known Allergies ? ?PAST MEDICAL HISTORY ?Past Medical History:  ?Diagnosis Date  ? Hypertension   ? Prostate cancer ([place])   ? ?Past Surgical History:  ?Procedure Laterality Date  ? BICEPS TENDON REPAIR Right   ? "reattach bicep on right arm"- 18 years ago per patient (11/10/21)  ? CATARACT EXTRACTION Right   ? FRACTURE SURGERY Left   ? 35 years ago  ? GAS INSERTION  11/11/2021  ? Procedure: INSERTION OF GAS;  Surgeon: Bernarda Caffey, MD;  Location: [place];  Service: Ophthalmology;;  C3F8  ? GAS/FLUID EXCHANGE Right 11/11/2021  ? Procedure: GAS/FLUID EXCHANGE;  Surgeon: Bernarda Caffey, MD;  Location: [place];  Service: Ophthalmology;  Laterality: Right;  ? PHOTOCOAGULATION WITH LASER Right 11/11/2021  ? Procedure: PHOTOCOAGULATION WITH LASER;  Surgeon: Bernarda Caffey, MD;  Location: [place];  Service: Ophthalmology;  Laterality: Right;  ? TESTICLE REMOVAL    ? as a child had 1 testicle removed  ?  VITRECTOMY 25 GAUGE WITH SCLERAL BUCKLE Right 11/11/2021  ? Procedure: VITRECTOMY 25 GAUGE WITH SCLERAL BUCKLE;  Surgeon: Bernarda Caffey, MD;  Location: [place];  Service: Ophthalmology;  Laterality: Right;  ? ?FAMILY HISTORY ?Family History  ?Problem Relation Age of Onset  ? Retinal detachment Mother   ? Diabetes Father   ? ?SOCIAL HISTORY ?Social History  ? ?Tobacco Use  ? Smoking status: Never  ? Smokeless tobacco: Current  ?  Types: Chew  ?Vaping Use  ? Vaping  Use: Never used  ?Substance Use Topics  ? Alcohol use: Yes  ?  Alcohol/week: 12.0 standard drinks  ?  Types: 12 Cans of beer per week  ? Drug use: Never  ?  ? ?  ?OPHTHALMIC EXAM: ? ?Base Eye Exam   ? ? Visual Acuity (Snellen - Linear)   ? ?   Right Left  ? Dist [place] CF at 3' 20/20 -2  ? Dist ph [place] NI   ? ?  ?  ? ? Tonometry (Tonopen, 8:31 AM)   ? ?   Right Left  ? Pressure 18   ? ?  ?  ? ? Pupils   ? ?   Dark Light Shape React APD  ? Right 5 5 Round None None  ? Left 4 3 Round Brisk None  ? ?  ?  ? ? Neuro/Psych   ? ? Oriented x3: Yes  ? Mood/Affect: Normal  ? ?  ?  ? ? Dilation   ? ? Right eye: 1.0% Mydriacyl, 2.5% Phenylephrine @ 8:31 AM  ? ?  ?  ? ?  ? ?Slit Lamp and Fundus Exam   ? ? Slit Lamp Exam   ? ?   Right Left  ? Lids/Lashes Dermatochalasis - upper lid, mild edema Dermatochalasis - upper lid  ? Conjunctiva/Sclera Subconjunctival hemorrhage improving, nasal and temporal pinguecula, Sutures intact nasal and temporal pinguecula, subconj heme improving, sutures dissolving  ? Cornea 1+ PEE, endo pigment inferiorly (no heme) mild tear film debris  ? Anterior Chamber Deep with fibrin reaction resolved, 2+ cell/pigment Deep and quiet  ? Iris Round and reactive Round and dilated  ? Lens PC IOL in good position 2+ Nuclear sclerosis, 2+ Cortical cataract  ? Anterior Vitreous Post vitrectomy -- gas bubble 55%-60% Vitreous syneresis  ? ?  ?  ? ? Fundus Exam   ? ?   Right Left  ? Disc Pink and Sharp Pink and Sharp, Compact  ? C/D Ratio 0.2 0.2  ? Macula Flat under gas Flat, Good foveal reflex, RPE mottling, No heme or edema  ? Vessels attenuated, Tortuous Mild copper wiring  ? Periphery Retina attached over buckle, good buckle height, good laser changes over buckle Originally: Bullous ST detachment from 0900-1200 extending posteriorly into macula, no obvious RT on depression, paving stone degeneration inferiorly -- Attached, mild pavingstone degeneration inferiorly  ? ?  ?  ? ?  ? ? ?IMAGING AND PROCEDURES  ?Imaging and  Procedures for 12/02/2021 ? ? ? ? ?  ?  ? ?  ?ASSESSMENT/PLAN: ? ?  ICD-10-CM   ?1. Right retinal detachment  H33.21 CANCELED: OCT, Retina - OU - Both Eyes  ?  ?2. Essential hypertension  I10   ?  ?3. Hypertensive retinopathy of both eyes  H35.033   ?  ?4. Pseudophakia  Z96.1   ?  ?5. Combined forms of age-related cataract of left eye  H25.812   ?  ? ?Rhegmatogenous retinal detachment, OD ?-  bullous superior mac off detachment, onset of foveal involvement 11/08/2021 by history ?- detached from 0900 to 12 oclock ?- Pt reports +family history of RD in mother ?- POW3 s/p SBP + PPV/PFO/EL/FAX/14% C3F8 OD, 03.30.2023 ?            - doing well  ?            - retina attached and in good position -- good buckle height and laser around breaks ?            - IOP 18 ?            - cont   PF 6x/day OD -- decrease to QID ?                        Cosopt Qdaily OD  ?                        PSO ung QID OD and prn -- decrease to QHS / PRN ?            - cont face down positioning 30 min/hr; avoid laying flat on back  ?            - post op drop and positioning instructions reviewed  ?            - tylenol/ibuprofen for pain  ?            - Rx given for breakthrough pain ? - f/u 3 weeks, POV ? ?2,3. Hypertensive retinopathy OU ?- discussed importance of tight BP control ?- monitor ? ?4. Pseudophakia OD ? - s/p CE/IOL w/ Dr. Alanda Slim ? - IOL in good position, doing well ? - monitor  ? ?5. Mixed Cataract OS ?- The symptoms of cataract, surgical options, and treatments and risks were discussed with patient. ?- discussed diagnosis and progression ?- monitor for now  ? ?Ophthalmic Meds Ordered this visit:  ?No orders of the defined types were placed in this encounter. ?  ? ?Return in about 3 weeks (around 12/23/2021) for f/u 3 weeks, RD OD, DFE, OCT. ? ?There are no Patient Instructions on file for this visit. ? ? ?Explained the diagnoses, plan, and follow up with the patient and they expressed understanding.  Patient expressed understanding  of the importance of proper follow up care.  ? ?This document serves as a record of services personally performed by Gardiner Sleeper, MD, PhD. It was created on their behalf by [place]. Owens Shark, OA an ophthalmic

## 2021-12-02 ENCOUNTER — Encounter (INDEPENDENT_AMBULATORY_CARE_PROVIDER_SITE_OTHER): Payer: Self-pay | Admitting: Ophthalmology

## 2021-12-02 ENCOUNTER — Ambulatory Visit (INDEPENDENT_AMBULATORY_CARE_PROVIDER_SITE_OTHER): Payer: Federal, State, Local not specified - PPO | Admitting: Ophthalmology

## 2021-12-02 DIAGNOSIS — Z961 Presence of intraocular lens: Secondary | ICD-10-CM

## 2021-12-02 DIAGNOSIS — I10 Essential (primary) hypertension: Secondary | ICD-10-CM

## 2021-12-02 DIAGNOSIS — H25812 Combined forms of age-related cataract, left eye: Secondary | ICD-10-CM

## 2021-12-02 DIAGNOSIS — H3321 Serous retinal detachment, right eye: Secondary | ICD-10-CM

## 2021-12-02 DIAGNOSIS — H35033 Hypertensive retinopathy, bilateral: Secondary | ICD-10-CM

## 2021-12-13 NOTE — Progress Notes (Signed)
?[facility] Note ? ?12/23/2021 ?  ? ?CHIEF COMPLAINT ?Patient presents for Post-op Follow-up ? ? ?HISTORY OF PRESENT ILLNESS: ?Cristian Mcgee is a 61 y.o. male who presents to the clinic today for:  ? ?HPI   ? ? Post-op Follow-up   ?In right eye.  Discomfort includes none.  I, the attending physician,  performed the HPI with the patient and updated documentation appropriately. ? ?  ?  ? ? Comments   ?6 week post op RD repair OD- He has noticed a big improvement over the past 2 weeks.  The bubble is now in the lower part of vision.  Using Cosopt qd OD, Prednisolone QID OD, and PSO ung qhs. ? ?  ?  ?Last edited by Bernarda Caffey, MD on 12/23/2021 11:27 PM.  ?  ? ?Pt states  ? ?Referring physician: ?Rankins, Bill Salinas, MD ?[place] ?[place],  [place] [id number] ? ?HISTORICAL INFORMATION:  ? ?Selected notes from the [facility] ?Referred by Dr. Anthony Sar for RD OD ?LEE:  ?Ocular Hx- ?PMH- ?  ? ?CURRENT MEDICATIONS: ?Current Outpatient Medications (Ophthalmic Drugs)  ?Medication Sig  ? bacitracin-polymyxin b (POLYSPORIN) ophthalmic ointment Place 1 application. into the right eye 4 (four) times daily.  ? dorzolamide-timolol (COSOPT) 22.3-6.8 MG/ML ophthalmic solution Place 1 drop into the right eye daily.  ? prednisoLONE acetate (PRED FORTE) 1 % ophthalmic suspension Place 1 drop into the right eye 4 (four) times daily.  ? atropine 1 % ophthalmic solution Place into the right eye 2 (two) times daily. (Patient not taking: Reported on 12/02/2021)  ? gatifloxacin (ZYMAXID) 0.5 % SOLN Place 1 drop into the right eye 4 (four) times daily. (Patient not taking: Reported on 12/02/2021)  ? prednisoLONE acetate (PRED FORTE) 1 % ophthalmic suspension Place 1 drop into the right eye 4 (four) times daily. (Patient not taking: Reported on 12/23/2021)  ? ?No current facility-administered medications for this visit. (Ophthalmic Drugs)  ? ?Current Outpatient Medications (Other)  ?Medication Sig  ?  allopurinol (ZYLOPRIM) 100 MG tablet Take 100 mg by mouth daily.  ? HYDROcodone-acetaminophen (NORCO/VICODIN) 5-325 MG tablet Take 1 tablet by mouth every 4 (four) hours as needed for moderate pain.  ? olmesartan (BENICAR) 20 MG tablet Take 20 mg by mouth daily.  ? ?No current facility-administered medications for this visit. (Other)  ? ?REVIEW OF SYSTEMS: ?ROS   ?Positive for: Eyes ?Negative for: Constitutional, Gastrointestinal, Neurological, Skin, Genitourinary, Musculoskeletal, HENT, Endocrine, Cardiovascular, Respiratory, Psychiatric, Allergic/Imm, Heme/Lymph ?Last edited by Leonie Douglas, [place] on 12/23/2021  8:23 AM.  ?  ? ? ?ALLERGIES ?No Known Allergies ? ?PAST MEDICAL HISTORY ?Past Medical History:  ?Diagnosis Date  ? Hypertension   ? Prostate cancer ([facility])   ? ?Past Surgical History:  ?Procedure Laterality Date  ? BICEPS TENDON REPAIR Right   ? "reattach bicep on right arm"- 18 years ago per patient (11/10/21)  ? CATARACT EXTRACTION Right   ? FRACTURE SURGERY Left   ? 35 years ago  ? GAS INSERTION  11/11/2021  ? Procedure: INSERTION OF GAS;  Surgeon: Bernarda Caffey, MD;  Location: [place];  Service: Ophthalmology;;  C3F8  ? GAS/FLUID EXCHANGE Right 11/11/2021  ? Procedure: GAS/FLUID EXCHANGE;  Surgeon: Bernarda Caffey, MD;  Location: [place];  Service: Ophthalmology;  Laterality: Right;  ? PHOTOCOAGULATION WITH LASER Right 11/11/2021  ? Procedure: PHOTOCOAGULATION WITH LASER;  Surgeon: Bernarda Caffey, MD;  Location: [place];  Service: Ophthalmology;  Laterality: Right;  ?  TESTICLE REMOVAL    ? as a child had 1 testicle removed  ? VITRECTOMY 25 GAUGE WITH SCLERAL BUCKLE Right 11/11/2021  ? Procedure: VITRECTOMY 25 GAUGE WITH SCLERAL BUCKLE;  Surgeon: Bernarda Caffey, MD;  Location: [place];  Service: Ophthalmology;  Laterality: Right;  ? ?FAMILY HISTORY ?Family History  ?Problem Relation Age of Onset  ? Retinal detachment Mother   ? Diabetes Father   ? ?SOCIAL HISTORY ?Social History  ? ?Tobacco Use  ? Smoking status: Never  ?  Smokeless tobacco: Current  ?  Types: Chew  ?Vaping Use  ? Vaping Use: Never used  ?Substance Use Topics  ? Alcohol use: Yes  ?  Alcohol/week: 12.0 standard drinks  ?  Types: 12 Cans of beer per week  ? Drug use: Never  ?  ? ?  ?OPHTHALMIC EXAM: ? ?Base Eye Exam   ? ? Visual Acuity (Snellen - Linear)   ? ?   Right Left  ? Dist [place] 20/150 20/20 -2  ? Dist ph [place] 20/50   ? ?  ?  ? ? Tonometry (Tonopen, 8:30 AM)   ? ?   Right Left  ? Pressure 13 14  ? ?  ?  ? ? Pupils   ? ?   Dark Light Shape React APD  ? Right 5 4 Round Brisk None  ? Left 3 2 Round Brisk None  ? ?  ?  ? ? Visual Fields (Counting fingers)   ? ?   Left Right  ?  Full Full  ? ?  ?  ? ? Extraocular Movement   ? ?   Right Left  ?  Full Full  ? ?  ?  ? ? Neuro/Psych   ? ? Oriented x3: Yes  ? Mood/Affect: Normal  ? ?  ?  ? ? Dilation   ? ? Both eyes: 1.0% Mydriacyl, 2.5% Phenylephrine @ 8:30 AM  ? ?  ?  ? ?  ? ?Slit Lamp and Fundus Exam   ? ? Slit Lamp Exam   ? ?   Right Left  ? Lids/Lashes Dermatochalasis - upper lid, mild edema Dermatochalasis - upper lid  ? Conjunctiva/Sclera White and quiet, nasal and temporal pinguecula, Sutures almost dissolved nasal and temporal pinguecula, subconj heme improving, sutures dissolving  ? Cornea Trace PEE, well healed cataract wound mild tear film debris  ? Anterior Chamber Deep with fibrin reaction resolved, 2+ cell/pigment Deep and quiet  ? Iris Round and reactive Round and dilated  ? Lens PC IOL in good position 2+ Nuclear sclerosis, 2+ Cortical cataract  ? Anterior Vitreous Post vitrectomy -- gas bubble 25-30% Vitreous syneresis  ? ?  ?  ? ? Fundus Exam   ? ?   Right Left  ? Disc Pink and Sharp Pink and Sharp, Compact  ? C/D Ratio 0.2 0.2  ? Macula Flat, Good foveal reflex, No heme or edema Flat, Good foveal reflex, RPE mottling, No heme or edema  ? Vessels mild attenuation, mild tortuosity Mild copper wiring  ? Periphery Retina attached over buckle, good buckle height, good laser changes over buckle Originally:  Bullous ST detachment from 0900-1200 extending posteriorly into macula, no obvious RT on depression, paving stone degeneration inferiorly Attached, mild pavingstone degeneration inferiorly  ? ?  ?  ? ?  ? ? ?IMAGING AND PROCEDURES  ?Imaging and Procedures for 12/23/2021 ? ?OCT, Retina - OU - Both Eyes   ? ?   ?Right Eye ?Quality was  good. Central Foveal Thickness: 285. Progression has improved. Findings include no SRF, normal foveal contour, no IRF (Retina re-attached, mildly decreased ellipsoid signal centrally).  ? ?Left Eye ?Quality was good. Central Foveal Thickness: 310. Progression has been stable. Findings include normal foveal contour, no IRF, no SRF, vitreomacular adhesion .  ? ?Notes ?*Images captured and stored on drive ? ?Diagnosis / Impression:  ?OD: Retina re-attached, mildly decreased ellipsoid signal centrally ?OS: NFP; no IRF/SRF  ? ?Clinical management:  ?See below ? ?Abbreviations: NFP - Normal foveal profile. CME - cystoid macular edema. PED - pigment epithelial detachment. IRF - intraretinal fluid. SRF - subretinal fluid. EZ - ellipsoid zone. ERM - epiretinal membrane. ORA - outer retinal atrophy. ORT - outer retinal tubulation. SRHM - subretinal hyper-reflective material. IRHM - intraretinal hyper-reflective material ? ? ?  ?  ?  ? ?  ?ASSESSMENT/PLAN: ? ?  ICD-10-CM   ?1. Right retinal detachment  H33.21 OCT, Retina - OU - Both Eyes  ?  ?2. Essential hypertension  I10   ?  ?3. Hypertensive retinopathy of both eyes  H35.033 OCT, Retina - OU - Both Eyes  ?  ?4. Pseudophakia  Z96.1   ?  ?5. Combined forms of age-related cataract of left eye  H25.812   ?  ? ? ?Rhegmatogenous retinal detachment, OD ?- bullous superior mac off detachment, onset of foveal involvement 11/08/2021 by history ?- detached from 0900 to 12 oclock ?- Pt reports +family history of RD in mother ?- POW6 s/p SBP + PPV/PFO/EL/FAX/14% C3F8 OD, 03.30.2023 ?            - doing well  ?            - retina attached and in good position  -- good buckle height and laser around breaks ?            - IOP 13 ? - gas bubble 25-30% ?            - cont   PF QID OD -- decrease to TID ?                        Cosopt Qdaily OD -- okay to stop ?

## 2021-12-23 ENCOUNTER — Ambulatory Visit (INDEPENDENT_AMBULATORY_CARE_PROVIDER_SITE_OTHER): Payer: Federal, State, Local not specified - PPO | Admitting: Ophthalmology

## 2021-12-23 ENCOUNTER — Encounter (INDEPENDENT_AMBULATORY_CARE_PROVIDER_SITE_OTHER): Payer: Self-pay | Admitting: Ophthalmology

## 2021-12-23 DIAGNOSIS — H35033 Hypertensive retinopathy, bilateral: Secondary | ICD-10-CM | POA: Diagnosis not present

## 2021-12-23 DIAGNOSIS — I10 Essential (primary) hypertension: Secondary | ICD-10-CM

## 2021-12-23 DIAGNOSIS — H3321 Serous retinal detachment, right eye: Secondary | ICD-10-CM

## 2021-12-23 DIAGNOSIS — Z961 Presence of intraocular lens: Secondary | ICD-10-CM

## 2021-12-23 DIAGNOSIS — H25812 Combined forms of age-related cataract, left eye: Secondary | ICD-10-CM

## 2022-01-04 NOTE — Progress Notes (Signed)
[facility] Note  01/13/2022    CHIEF COMPLAINT Patient presents for Retina Follow Up   HISTORY OF PRESENT ILLNESS: Cristian Mcgee is a 61 y.o. male who presents to the clinic today for:   HPI     Retina Follow Up   Patient presents with  Other.  In right eye.  This started 3 weeks ago.  I, the attending physician,  performed the HPI with the patient and updated documentation appropriately.        Comments   Patient here for 3 weeks retina follow up for RD OD. Patient states vision pretty good. Coming along. No eye pain.       Last edited by Bernarda Caffey, MD on 01/15/2022  2:10 AM.    Pt states as of this morning, he cannot see the gas bubble anymore, he states vision is pretty good  Referring physician: Aretta Nip, MD [place],  [place] [address]  HISTORICAL INFORMATION:   Selected notes from the MEDICAL RECORD NUMBER Referred by Dr. Anthony Sar for RD OD LEE:  Ocular Hx- PMH-    CURRENT MEDICATIONS: Current Outpatient Medications (Ophthalmic Drugs)  Medication Sig   bacitracin-polymyxin b (POLYSPORIN) ophthalmic ointment Place 1 application. into the right eye 4 (four) times daily.   dorzolamide-timolol (COSOPT) 22.3-6.8 MG/ML ophthalmic solution Place 1 drop into the right eye daily.   gatifloxacin (ZYMAXID) 0.5 % SOLN Place 1 drop into the right eye 4 (four) times daily.   atropine 1 % ophthalmic solution Place into the right eye 2 (two) times daily. (Patient not taking: Reported on 12/02/2021)   prednisoLONE acetate (PRED FORTE) 1 % ophthalmic suspension Place 1 drop into the right eye 4 (four) times daily. (Patient not taking: Reported on 01/13/2022)   prednisoLONE acetate (PRED FORTE) 1 % ophthalmic suspension Place 1 drop into the right eye 4 (four) times daily. (Patient not taking: Reported on 12/23/2021)   No current facility-administered medications for this visit. (Ophthalmic Drugs)   Current Outpatient  Medications (Other)  Medication Sig   allopurinol (ZYLOPRIM) 100 MG tablet Take 100 mg by mouth daily.   HYDROcodone-acetaminophen (NORCO/VICODIN) 5-325 MG tablet Take 1 tablet by mouth every 4 (four) hours as needed for moderate pain.   olmesartan (BENICAR) 20 MG tablet Take 20 mg by mouth daily.   No current facility-administered medications for this visit. (Other)   REVIEW OF SYSTEMS:  ALLERGIES No Known Allergies  PAST MEDICAL HISTORY Past Medical History:  Diagnosis Date   Hypertension    Prostate cancer ([place])    Past Surgical History:  Procedure Laterality Date   BICEPS TENDON REPAIR Right    "reattach bicep on right arm"- 18 years ago per patient (11/10/21)   CATARACT EXTRACTION Right    FRACTURE SURGERY Left    35 years ago   GAS INSERTION  11/11/2021   Procedure: INSERTION OF GAS;  Surgeon: Bernarda Caffey, MD;  Location: [facility];  Service: Ophthalmology;;  C3F8   GAS/FLUID EXCHANGE Right 11/11/2021   Procedure: GAS/FLUID EXCHANGE;  Surgeon: Bernarda Caffey, MD;  Location: [facility];  Service: Ophthalmology;  Laterality: Right;   PHOTOCOAGULATION WITH LASER Right 11/11/2021   Procedure: PHOTOCOAGULATION WITH LASER;  Surgeon: Bernarda Caffey, MD;  Location: [facility];  Service: Ophthalmology;  Laterality: Right;   TESTICLE REMOVAL     as a child had 1 testicle removed   VITRECTOMY 25 GAUGE WITH SCLERAL BUCKLE Right 11/11/2021   Procedure: VITRECTOMY  [place];  Surgeon: Bernarda Caffey, MD;  Location: [facility];  Service: Ophthalmology;  Laterality: Right;   FAMILY HISTORY Family History  Problem Relation Age of Onset   Retinal detachment Mother    Diabetes Father    SOCIAL HISTORY Social History   Tobacco Use   Smoking status: Never   Smokeless tobacco: Current    Types: Chew  Vaping Use   Vaping Use: Never used  Substance Use Topics   Alcohol use: Yes    Alcohol/week: 12.0 standard drinks    Types: 12 Cans of beer per week   Drug use: Never        OPHTHALMIC EXAM:  Base Eye Exam     Visual Acuity (Snellen - Linear)       Right Left   Dist [place] 20/100 -1 20/20 -1   Dist ph [place] 20/40 +2          Tonometry (Tonopen, 8:50 AM)       Right Left   Pressure 12 14         Pupils       Dark Light Shape React APD   Right 4 3 Round Brisk None   Left 3 2 Round Brisk None         Visual Fields (Counting fingers)       Left Right    Full Full         Extraocular Movement       Right Left    Full, Ortho Full, Ortho         Neuro/Psych     Oriented x3: Yes   Mood/Affect: Normal         Dilation     Both eyes: 1.0% Mydriacyl, 2.5% Phenylephrine @ 8:50 AM           Slit Lamp and Fundus Exam     Slit Lamp Exam       Right Left   Lids/Lashes Dermatochalasis - upper lid, mild edema Dermatochalasis - upper lid   Conjunctiva/Sclera White and quiet, nasal and temporal pinguecula, Sutures dissolved nasal and temporal pinguecula, subconj heme improving, sutures dissolving   Cornea Trace PEE, well healed cataract wound mild tear film debris   Anterior Chamber Deep and quiet Deep and quiet   Iris Round and reactive Round and dilated   Lens PC IOL in good position 2+ Nuclear sclerosis, 2+ Cortical cataract   Anterior Vitreous Post vitrectomy -- gas bubble gone Vitreous syneresis         Fundus Exam       Right Left   Disc Pink and Sharp Pink and Sharp, Compact   C/D Ratio 0.2 0.2   Macula Flat, Good foveal reflex, No heme or edema Flat, Good foveal reflex, RPE mottling, No heme or edema   Vessels mild tortuosity, attenuated Mild copper wiring   Periphery Retina attached over buckle, good buckle height, good laser changes over buckle Originally: Bullous ST detachment from 0900-1200 extending posteriorly into macula, no obvious RT on depression, paving stone degeneration inferiorly Attached, mild pavingstone degeneration inferiorly            IMAGING AND PROCEDURES  Imaging and Procedures for  01/13/2022  OCT, Retina - OU - Both Eyes       Right Eye Quality was good. Central Foveal Thickness: 299. Progression has improved. Findings include no SRF, normal foveal contour, no IRF (Retina re-attached, mildly decreased ellipsoid signal centrally).   Left Eye Quality  was good. Central Foveal Thickness: 312. Progression has been stable. Findings include normal foveal contour, no IRF, no SRF, vitreomacular adhesion .   Notes *Images captured and stored on drive  Diagnosis / Impression:  OD: Retina re-attached, mildly decreased ellipsoid signal centrally OS: NFP; no IRF/SRF   Clinical management:  See below  Abbreviations: NFP - Normal foveal profile. CME - cystoid macular edema. PED - pigment epithelial detachment. IRF - intraretinal fluid. SRF - subretinal fluid. EZ - ellipsoid zone. ERM - epiretinal membrane. ORA - outer retinal atrophy. ORT - outer retinal tubulation. SRHM - subretinal hyper-reflective material. IRHM - intraretinal hyper-reflective material            ASSESSMENT/PLAN:    ICD-10-CM   1. Right retinal detachment  H33.21 OCT, Retina - OU - Both Eyes    2. Essential hypertension  I10     3. Hypertensive retinopathy of both eyes  H35.033 OCT, Retina - OU - Both Eyes    4. Pseudophakia  Z96.1     5. 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 Hypertensive retinopathy OU - discussed importance of tight BP control - monitor   4. Pseudophakia OD  - s/p CE/IOL w/ Dr. Alanda Slim  - IOL in good position, doing well  - monitor   5. Mixed Cataract OS - The symptoms of cataract, surgical options, and treatments and risks were discussed with patient. - discussed diagnosis and progression - monitor for now  Ophthalmic Meds Ordered this visit:  No orders of the defined types were placed in this encounter.    Return for f/u 6-8 weeks, RD OD, DFE, OCT.  There are no Patient Instructions on file for this visit.   Explained the diagnoses, plan, and follow up with the patient and they expressed understanding.  Patient expressed understanding of the importance of proper follow up care.   This document serves as a record of services personally performed by Gardiner Sleeper, MD, PhD. It was created on their behalf by San Jetty. Owens Shark, OA an ophthalmic technician. The creation of this record is the provider's dictation and/or activities during the visit.    Electronically signed by: San Jetty. [place], [place] 05.23.223 2:11 AM   Gardiner Sleeper, M.D., Ph.D. Diseases & Surgery of the Retina and Vitreous [facility]  I have reviewed the above documentation for accuracy and completeness, and I agree with the above. Gardiner Sleeper, M.D., Ph.D. 01/15/22  2:13 AM    Abbreviations: M myopia (nearsighted); A astigmatism; H hyperopia (farsighted); P presbyopia; Mrx spectacle prescription;  CTL contact lenses; OD right eye; OS left eye; OU both eyes  XT exotropia; ET esotropia; PEK punctate epithelial keratitis; PEE punctate epithelial erosions; DES dry eye syndrome; MGD meibomian gland dysfunction; ATs artificial tears; PFAT's preservative free artificial tears; [place] nuclear sclerotic cataract; PSC posterior subcapsular cataract; ERM epi-retinal membrane; PVD posterior vitreous detachment; RD retinal  detachment; DM diabetes mellitus; DR diabetic retinopathy; NPDR non-proliferative diabetic retinopathy; PDR proliferative diabetic retinopathy; CSME clinically significant macular edema; DME diabetic macular edema; dbh dot blot hemorrhages; CWS cotton wool spot; POAG primary open angle glaucoma; C/D cup-to-disc ratio; HVF humphrey visual field; GVF goldmann visual field; OCT optical coherence tomography; IOP intraocular pressure; BRVO Branch retinal vein occlusion; CRVO central retinal vein occlusion; CRAO central retinal artery occlusion; BRAO branch retinal artery occlusion; RT retinal tear; SB scleral buckle; PPV pars plana vitrectomy; VH Vitreous hemorrhage; PRP panretinal laser photocoagulation; IVK intravitreal kenalog; VMT vitreomacular traction; MH Macular hole;  NVD neovascularization of the disc; NVE neovascularization elsewhere; AREDS age related eye disease study; ARMD age related macular degeneration; POAG primary open angle glaucoma; EBMD epithelial/anterior basement membrane dystrophy; ACIOL anterior chamber intraocular lens; IOL intraocular lens; PCIOL posterior chamber intraocular lens; Phaco/IOL phacoemulsification with intraocular lens placement; [place] photorefractive keratectomy; LASIK laser assisted in situ keratomileusis; HTN hypertension; DM diabetes mellitus; COPD chronic obstructive pulmonary disease

## 2022-01-13 ENCOUNTER — Encounter (INDEPENDENT_AMBULATORY_CARE_PROVIDER_SITE_OTHER): Payer: Self-pay | Admitting: Ophthalmology

## 2022-01-13 ENCOUNTER — Ambulatory Visit (INDEPENDENT_AMBULATORY_CARE_PROVIDER_SITE_OTHER): Payer: Federal, State, Local not specified - PPO | Admitting: Ophthalmology

## 2022-01-13 DIAGNOSIS — H3321 Serous retinal detachment, right eye: Secondary | ICD-10-CM | POA: Diagnosis not present

## 2022-01-13 DIAGNOSIS — H25812 Combined forms of age-related cataract, left eye: Secondary | ICD-10-CM

## 2022-01-13 DIAGNOSIS — I10 Essential (primary) hypertension: Secondary | ICD-10-CM

## 2022-01-13 DIAGNOSIS — H35033 Hypertensive retinopathy, bilateral: Secondary | ICD-10-CM

## 2022-01-13 DIAGNOSIS — Z961 Presence of intraocular lens: Secondary | ICD-10-CM

## 2022-01-15 ENCOUNTER — Encounter (INDEPENDENT_AMBULATORY_CARE_PROVIDER_SITE_OTHER): Payer: Self-pay | Admitting: Ophthalmology

## 2022-02-25 NOTE — Progress Notes (Signed)
[facility] Note  03/11/2022    CHIEF COMPLAINT Patient presents for Retina Follow Up   HISTORY OF PRESENT ILLNESS: Cristian Mcgee is a 61 y.o. male who presents to the clinic today for:   HPI     Retina Follow Up   Patient presents with  Retinal Break/Detachment.  In right eye.  Severity is moderate.  Duration of 8 weeks.  I, the attending physician,  performed the HPI with the patient and updated documentation appropriately.        Comments   Patient states vision the same OU. No new floaters or flashes.      Last edited by Bernarda Caffey, MD on 03/11/2022  9:39 AM.    Patient states that he was hoping for better vision improvement at this point.    Referring physician: Harlen Labs, MD [phone] [place],  [place] [address]  HISTORICAL INFORMATION:   Selected notes from the MEDICAL RECORD NUMBER Referred by Dr. Anthony Sar for RD OD LEE:  Ocular Hx- PMH-    CURRENT MEDICATIONS: Current Outpatient Medications (Ophthalmic Drugs)  Medication Sig   bacitracin-polymyxin b (POLYSPORIN) ophthalmic ointment Place 1 application. into the right eye 4 (four) times daily.   dorzolamide-timolol (COSOPT) 22.3-6.8 MG/ML ophthalmic solution Place 1 drop into the right eye daily.   gatifloxacin (ZYMAXID) 0.5 % SOLN Place 1 drop into the right eye 4 (four) times daily.   atropine 1 % ophthalmic solution Place into the right eye 2 (two) times daily. (Patient not taking: Reported on 12/02/2021)   prednisoLONE acetate (PRED FORTE) 1 % ophthalmic suspension Place 1 drop into the right eye 4 (four) times daily. (Patient not taking: Reported on 01/13/2022)   prednisoLONE acetate (PRED FORTE) 1 % ophthalmic suspension Place 1 drop into the right eye 4 (four) times daily. (Patient not taking: Reported on 12/23/2021)   No current facility-administered medications for this visit. (Ophthalmic Drugs)   Current Outpatient Medications (Other)  Medication Sig    allopurinol (ZYLOPRIM) 100 MG tablet Take 100 mg by mouth daily.   HYDROcodone-acetaminophen (NORCO/VICODIN) 5-325 MG tablet Take 1 tablet by mouth every 4 (four) hours as needed for moderate pain.   olmesartan (BENICAR) 20 MG tablet Take 20 mg by mouth daily.   No current facility-administered medications for this visit. (Other)   REVIEW OF SYSTEMS: ROS   Positive for: Eyes Negative for: Constitutional, Gastrointestinal, Neurological, Skin, Genitourinary, Musculoskeletal, HENT, Endocrine, Cardiovascular, Respiratory, Psychiatric, Allergic/Imm, Heme/Lymph Last edited by Roselee Nova D, COT on 03/11/2022  8:30 AM.     ALLERGIES No Known Allergies  PAST MEDICAL HISTORY Past Medical History:  Diagnosis Date   Hypertension    Prostate cancer [facility])    Past Surgical History:  Procedure Laterality Date   BICEPS TENDON REPAIR Right    "reattach bicep on right arm"- 18 years ago per patient (11/10/21)   CATARACT EXTRACTION Right    FRACTURE SURGERY Left    35 years ago   GAS INSERTION  11/11/2021   Procedure: INSERTION OF GAS;  Surgeon: Bernarda Caffey, MD;  Location: [facility];  Service: Ophthalmology;;  C3F8   GAS/FLUID EXCHANGE Right 11/11/2021   Procedure: GAS/FLUID EXCHANGE;  Surgeon: Bernarda Caffey, MD;  Location: [facility];  Service: Ophthalmology;  Laterality: Right;   PHOTOCOAGULATION WITH LASER Right 11/11/2021   Procedure: PHOTOCOAGULATION WITH LASER;  Surgeon: Bernarda Caffey, MD;  Location: [facility];  Service: Ophthalmology;  Laterality: Right;   TESTICLE  REMOVAL     as a child had 1 testicle removed   VITRECTOMY 25 GAUGE WITH SCLERAL BUCKLE Right 11/11/2021   Procedure: VITRECTOMY 25 GAUGE WITH SCLERAL BUCKLE;  Surgeon: Bernarda Caffey, MD;  Location: [facility];  Service: Ophthalmology;  Laterality: Right;   FAMILY HISTORY Family History  Problem Relation Age of Onset   Retinal detachment Mother    Diabetes Father    SOCIAL HISTORY Social History   Tobacco Use   Smoking status: Never    Smokeless tobacco: Current    Types: Chew  Vaping Use   Vaping Use: Never used  Substance Use Topics   Alcohol use: Yes    Alcohol/week: 12.0 standard drinks of alcohol    Types: 12 Cans of beer per week   Drug use: Never       OPHTHALMIC EXAM:  Base Eye Exam     Visual Acuity (Snellen - Linear)       Right Left   Dist [place] 20/100 -2 20/20   Dist ph [place] 20/40          Tonometry (Tonopen, 8:35 AM)       Right Left   Pressure 14 19         Pupils       Dark Light Shape React APD   Right 4 3.5 Round Minimal None   Left 3 2 Round Brisk None         Visual Fields     Counting fingers         Extraocular Movement       Right Left    Full, Ortho Full, Ortho         Neuro/Psych     Oriented x3: Yes   Mood/Affect: Normal         Dilation     Both eyes: 1.0% Mydriacyl, 2.5% Phenylephrine @ 8:34 AM           Slit Lamp and Fundus Exam     Slit Lamp Exam       Right Left   Lids/Lashes Dermatochalasis - upper lid, mild edema Dermatochalasis - upper lid   Conjunctiva/Sclera White and quiet, nasal and temporal pinguecula nasal and temporal pinguecula, subconj heme improving, sutures dissolving   Cornea Trace PEE, well healed cataract wound trace PEE   Anterior Chamber Deep and quiet Deep and quiet   Iris Round and dilated Round and dilated   Lens PC IOL in good position 2+ Nuclear sclerosis, 2+ Cortical cataract   Anterior Vitreous Post vitrectomy -- clear Vitreous syneresis         Fundus Exam       Right Left   Disc Pink and Sharp Pink and Sharp, Compact   C/D Ratio 0.2 0.2   Macula Flat, Good foveal reflex, No heme or edema Flat, Good foveal reflex, RPE mottling, No heme or edema   Vessels mild tortuosity, attenuated, Copper wiring Mild copper wiring, mild vascular attenuation   Periphery Retina attached over buckle, good buckle height, good laser changes over buckle Originally: Bullous ST detachment from 0900-1200 extending posteriorly  into macula, no obvious RT on depression, paving stone degeneration inferiorly Attached, mild pavingstone degeneration inferiorly            IMAGING AND PROCEDURES  Imaging and Procedures for 03/11/2022  OCT, Retina - OU - Both Eyes       Right Eye Quality was good. Central Foveal Thickness: 307. Progression has improved. Findings include normal foveal contour,  no IRF, no SRF (Retina re-attached, mildly decreased ellipsoid signal centrally--improving).   Left Eye Quality was good. Central Foveal Thickness: 311. Progression has been stable. Findings include normal foveal contour, no IRF, no SRF, vitreomacular adhesion .   Notes *Images captured and stored on drive  Diagnosis / Impression:  OD: Retina re-attached, mildly decreased ellipsoid signal centrally--improving OS: NFP; no IRF/SRF   Clinical management:  See below  Abbreviations: NFP - Normal foveal profile. CME - cystoid macular edema. PED - pigment epithelial detachment. IRF - intraretinal fluid. SRF - subretinal fluid. EZ - ellipsoid zone. ERM - epiretinal membrane. ORA - outer retinal atrophy. ORT - outer retinal tubulation. SRHM - subretinal hyper-reflective material. IRHM - intraretinal hyper-reflective material            ASSESSMENT/PLAN:    ICD-10-CM   1. Right retinal detachment  H33.21     2. Essential hypertension  I10     3. Hypertensive retinopathy of both eyes  H35.033 OCT, Retina - OU - Both Eyes    4. Pseudophakia  Z96.1     5. 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, onset of foveal involvement 11/08/2021 by history - detached from 0900 to 12 oclock - Pt reports +family history of RD in mother - s/p SBP + PPV/PFO/EL/FAX/14% C3F8 OD, 03.30.2023             - doing well              - retina attached and in good position -- good buckle height and laser around breaks  - gas bubble gone;  vitreous clear  - BCVA 20/40 +2             - IOP 14             - Finished drop taper  - OCT shows retina reattaached; interval improvement in central ellipsoid signal - discussed the effects of the scleral buckle on Mrx and that the healing process following RD repair can extend to 12+ mos  - f/u 4-6 mos -- DFE/OCT  2,3. Hypertensive retinopathy OU - discussed importance of tight BP control - continue to monitor   4. Pseudophakia OD  - s/p CE/IOL w/ Dr. Alanda Slim  - IOL in good position, doing well  - Cleared to get a new refraction   - continue to monitor    5. Mixed Cataract OS - The symptoms of cataract, surgical options, and treatments and risks were discussed with patient. - discussed diagnosis and progression - continue to monitor  Ophthalmic Meds Ordered this visit:  No orders of the defined types were placed in this encounter.    Return in about 6 months (around 09/11/2022) for 4-6 mo RD OD , DFE, OCT.  There are no Patient Instructions on file for this visit.   Explained the diagnoses, plan, and follow up with the patient and they expressed understanding.  Patient expressed understanding of the importance of proper follow up care.   This document serves as a record of services personally performed by Gardiner Sleeper, MD, PhD. It was created on their behalf by Leonie Douglas, an ophthalmic technician. The creation of this record is the provider's dictation and/or activities during the visit.    Electronically signed by: Leonie Douglas COA, 03/11/22  9:40 AM  Gardiner Sleeper, M.D., Ph.D. Diseases & Surgery of the Retina and  Vitreous [facility]  I have reviewed the above documentation for accuracy and completeness, and I agree with the above. Gardiner Sleeper, M.D., Ph.D. 03/11/22 9:43 AM   Abbreviations: M myopia (nearsighted); A astigmatism; H hyperopia (farsighted); P presbyopia; Mrx spectacle prescription;  CTL contact lenses; OD right eye; OS  left eye; OU both eyes  XT exotropia; ET esotropia; PEK punctate epithelial keratitis; PEE punctate epithelial erosions; DES dry eye syndrome; MGD meibomian gland dysfunction; ATs artificial tears; PFAT's preservative free artificial tears; [place] nuclear sclerotic cataract; PSC posterior subcapsular cataract; ERM epi-retinal membrane; PVD posterior vitreous detachment; RD retinal detachment; DM diabetes mellitus; DR diabetic retinopathy; NPDR non-proliferative diabetic retinopathy; PDR proliferative diabetic retinopathy; CSME clinically significant macular edema; DME diabetic macular edema; dbh dot blot hemorrhages; CWS cotton wool spot; POAG primary open angle glaucoma; C/D cup-to-disc ratio; HVF humphrey visual field; GVF goldmann visual field; OCT optical coherence tomography; IOP intraocular pressure; BRVO Branch retinal vein occlusion; CRVO central retinal vein occlusion; CRAO central retinal artery occlusion; BRAO branch retinal artery occlusion; RT retinal tear; SB scleral buckle; PPV pars plana vitrectomy; VH Vitreous hemorrhage; PRP panretinal laser photocoagulation; IVK intravitreal kenalog; VMT vitreomacular traction; MH Macular hole;  NVD neovascularization of the disc; NVE neovascularization elsewhere; AREDS age related eye disease study; ARMD age related macular degeneration; POAG primary open angle glaucoma; EBMD epithelial/anterior basement membrane dystrophy; ACIOL anterior chamber intraocular lens; IOL intraocular lens; PCIOL posterior chamber intraocular lens; Phaco/IOL phacoemulsification with intraocular lens placement; [place] photorefractive keratectomy; LASIK laser assisted in situ keratomileusis; HTN hypertension; DM diabetes mellitus; COPD chronic obstructive pulmonary disease

## 2022-03-10 ENCOUNTER — Encounter (INDEPENDENT_AMBULATORY_CARE_PROVIDER_SITE_OTHER): Payer: Federal, State, Local not specified - PPO | Admitting: Ophthalmology

## 2022-03-11 ENCOUNTER — Encounter (INDEPENDENT_AMBULATORY_CARE_PROVIDER_SITE_OTHER): Payer: Self-pay | Admitting: Ophthalmology

## 2022-03-11 ENCOUNTER — Ambulatory Visit (INDEPENDENT_AMBULATORY_CARE_PROVIDER_SITE_OTHER): Payer: Federal, State, Local not specified - PPO | Admitting: Ophthalmology

## 2022-03-11 DIAGNOSIS — H35033 Hypertensive retinopathy, bilateral: Secondary | ICD-10-CM | POA: Diagnosis not present

## 2022-03-11 DIAGNOSIS — H3321 Serous retinal detachment, right eye: Secondary | ICD-10-CM | POA: Diagnosis not present

## 2022-03-11 DIAGNOSIS — I10 Essential (primary) hypertension: Secondary | ICD-10-CM | POA: Diagnosis not present

## 2022-03-11 DIAGNOSIS — H25812 Combined forms of age-related cataract, left eye: Secondary | ICD-10-CM

## 2022-03-11 DIAGNOSIS — Z961 Presence of intraocular lens: Secondary | ICD-10-CM | POA: Diagnosis not present

## 2022-05-02 DIAGNOSIS — C61 Malignant neoplasm of prostate: Secondary | ICD-10-CM | POA: Diagnosis not present

## 2022-05-09 DIAGNOSIS — H40033 Anatomical narrow angle, bilateral: Secondary | ICD-10-CM | POA: Diagnosis not present

## 2022-05-09 DIAGNOSIS — H04123 Dry eye syndrome of bilateral lacrimal glands: Secondary | ICD-10-CM | POA: Diagnosis not present

## 2022-05-09 DIAGNOSIS — N402 Nodular prostate without lower urinary tract symptoms: Secondary | ICD-10-CM | POA: Diagnosis not present

## 2022-05-09 DIAGNOSIS — C61 Malignant neoplasm of prostate: Secondary | ICD-10-CM | POA: Diagnosis not present

## 2022-06-01 DIAGNOSIS — M109 Gout, unspecified: Secondary | ICD-10-CM | POA: Diagnosis not present

## 2022-06-01 DIAGNOSIS — I10 Essential (primary) hypertension: Secondary | ICD-10-CM | POA: Diagnosis not present

## 2022-06-03 DIAGNOSIS — G44219 Episodic tension-type headache, not intractable: Secondary | ICD-10-CM | POA: Diagnosis not present

## 2022-08-01 NOTE — Progress Notes (Addendum)
[facility] Note  08/11/2022    CHIEF COMPLAINT Patient presents for Retina Follow Up   HISTORY OF PRESENT ILLNESS: Cristian Mcgee is a 61 y.o. male who presents to the clinic today for:   HPI     Retina Follow Up   Patient presents with  Retinal Break/Detachment.  In right eye.  This started 5 months ago.  I, the attending physician,  performed the HPI with the patient and updated documentation appropriately.        Comments   Patient here for 5 months retina follow up for RD OD. Patient states vision with contacts makes a difference. Vision about like it was before detachment. No eye pain.       Last edited by Bernarda Caffey, MD on 08/12/2022 12:30 PM.     Patient has gotten CL from Dr. Milagros Reap since he was here last, he states distance is great, but near vision is not that great, pt denies fol OS   Referring physician: Harlen Labs, MD [phone] [place],  [place] [address]  HISTORICAL INFORMATION:   Selected notes from the MEDICAL RECORD NUMBER Referred by Dr. Anthony Sar for RD OD LEE:  Ocular Hx- PMH-    CURRENT MEDICATIONS: Current Outpatient Medications (Ophthalmic Drugs)  Medication Sig   bacitracin-polymyxin b (POLYSPORIN) ophthalmic ointment Place 1 application. into the right eye 4 (four) times daily.   dorzolamide-timolol (COSOPT) 22.3-6.8 MG/ML ophthalmic solution Place 1 drop into the right eye daily.   gatifloxacin (ZYMAXID) 0.5 % SOLN Place 1 drop into the right eye 4 (four) times daily.   atropine 1 % ophthalmic solution Place into the right eye 2 (two) times daily. (Patient not taking: Reported on 12/02/2021)   prednisoLONE acetate (PRED FORTE) 1 % ophthalmic suspension Place 1 drop into the right eye 4 (four) times daily. (Patient not taking: Reported on 01/13/2022)   prednisoLONE acetate (PRED FORTE) 1 % ophthalmic suspension Place 1 drop into the right eye 4 (four) times daily. (Patient not taking: Reported on 12/23/2021)    No current facility-administered medications for this visit. (Ophthalmic Drugs)   Current Outpatient Medications (Other)  Medication Sig   allopurinol (ZYLOPRIM) 100 MG tablet Take 100 mg by mouth daily.   HYDROcodone-acetaminophen (NORCO/VICODIN) 5-325 MG tablet Take 1 tablet by mouth every 4 (four) hours as needed for moderate pain.   olmesartan (BENICAR) 20 MG tablet Take 20 mg by mouth daily.   No current facility-administered medications for this visit. (Other)   REVIEW OF SYSTEMS: ROS   Positive for: Eyes Negative for: Constitutional, Gastrointestinal, Neurological, Skin, Genitourinary, Musculoskeletal, HENT, Endocrine, Cardiovascular, Respiratory, Psychiatric, Allergic/Imm, Heme/Lymph Last edited by Theodore Demark, COA on 08/11/2022  8:30 AM.     ALLERGIES No Known Allergies  PAST MEDICAL HISTORY Past Medical History:  Diagnosis Date   Hypertension    Prostate cancer [facility])    Past Surgical History:  Procedure Laterality Date   BICEPS TENDON REPAIR Right    "reattach bicep on right arm"- 18 years ago per patient (11/10/21)   CATARACT EXTRACTION Right    FRACTURE SURGERY Left    35 years ago   GAS INSERTION  11/11/2021   Procedure: INSERTION OF GAS;  Surgeon: Bernarda Caffey, MD;  Location: [facility];  Service: Ophthalmology;;  C3F8   GAS/FLUID EXCHANGE Right 11/11/2021   Procedure: GAS/FLUID EXCHANGE;  Surgeon: Bernarda Caffey, MD;  Location: [facility];  Service: Ophthalmology;  Laterality: Right;  PHOTOCOAGULATION WITH LASER Right 11/11/2021   Procedure: PHOTOCOAGULATION WITH LASER;  Surgeon: Bernarda Caffey, MD;  Location: [facility];  Service: Ophthalmology;  Laterality: Right;   TESTICLE REMOVAL     as a child had 1 testicle removed   VITRECTOMY 25 GAUGE WITH SCLERAL BUCKLE Right 11/11/2021   Procedure: VITRECTOMY 25 GAUGE WITH SCLERAL BUCKLE;  Surgeon: Bernarda Caffey, MD;  Location: [facility];  Service: Ophthalmology;  Laterality: Right;   FAMILY HISTORY Family History  Problem  Relation Age of Onset   Retinal detachment Mother    Diabetes Father    SOCIAL HISTORY Social History   Tobacco Use   Smoking status: Never   Smokeless tobacco: Current    Types: Chew  Vaping Use   Vaping Use: Never used  Substance Use Topics   Alcohol use: Yes    Alcohol/week: 12.0 standard drinks of alcohol    Types: 12 Cans of beer per week   Drug use: Never       OPHTHALMIC EXAM:  Base Eye Exam     Visual Acuity (Snellen - Linear)       Right Left   Dist cc 20/40 -2 20/25 -2   Dist ph cc 20/25 -1 20/20 -1    Correction: Contacts         Tonometry (Tonopen, 8:27 AM)       Right Left   Pressure 12 14         Pupils       Dark Light Shape React APD   Right 4 4.5 Round Minimal None   Left 3 2 Round Brisk None         Visual Fields (Counting fingers)       Left Right    Full Full         Extraocular Movement       Right Left    Full, Ortho Full, Ortho         Neuro/Psych     Oriented x3: Yes   Mood/Affect: Normal         Dilation     Both eyes:            Slit Lamp and Fundus Exam     Slit Lamp Exam       Right Left   Lids/Lashes Dermatochalasis - upper lid, Meibomian gland dysfunction Dermatochalasis - upper lid, mild MGD   Conjunctiva/Sclera White and quiet, nasal and temporal pinguecula nasal and temporal pinguecula   Cornea 1+PEE, well healed cataract wound, tear film debris arcus, 2+ inferior Punctate epithelial erosions   Anterior Chamber deep and clear Deep and quiet   Iris Round and dilated Round and dilated   Lens PC IOL in good position 2-3+ Nuclear sclerosis with brunescence, 2-3+ Cortical cataract   Anterior Vitreous Post vitrectomy -- clear Vitreous syneresis         Fundus Exam       Right Left   Disc Pink and Sharp Pink and Sharp, Compact   C/D Ratio 0.2 0.2   Macula Flat, Good foveal reflex, No heme or edema, trace ERM Flat, Good foveal reflex, RPE mottling, No heme or edema   Vessels  attenuated, Tortuous, mild copper wiring attenuated, Tortuous   Periphery Retina attached over buckle, good buckle height, good laser changes over buckle Originally: Bullous ST detachment from 0900-1200 extending posteriorly into macula, no obvious RT on depression, paving stone degeneration inferiorly Attached, mild pavingstone degeneration inferiorly  IMAGING AND PROCEDURES  Imaging and Procedures for 08/11/2022  OCT, Retina - OU - Both Eyes       Right Eye Quality was good. Central Foveal Thickness: 315. Progression has improved. Findings include normal foveal contour, no IRF, no SRF (Retina re-attached, interval improvement in central ellipsoid signal).   Left Eye Quality was good. Central Foveal Thickness: 313. Progression has been stable. Findings include normal foveal contour, no IRF, no SRF, vitreomacular adhesion .   Notes *Images captured and stored on drive  Diagnosis / Impression:  OD: Retina re-attached, interval improvement in central ellipsoid signal OS: NFP; no IRF/SRF   Clinical management:  See below  Abbreviations: NFP - Normal foveal profile. CME - cystoid macular edema. PED - pigment epithelial detachment. IRF - intraretinal fluid. SRF - subretinal fluid. EZ - ellipsoid zone. ERM - epiretinal membrane. ORA - outer retinal atrophy. ORT - outer retinal tubulation. SRHM - subretinal hyper-reflective material. IRHM - intraretinal hyper-reflective material            ASSESSMENT/PLAN:    ICD-10-CM   1. Right retinal detachment  H33.21 OCT, Retina - OU - Both Eyes    2. Essential hypertension  I10     3. Hypertensive retinopathy of both eyes  H35.033     4. Pseudophakia  Z96.1     5. 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, onset of foveal involvement 11/08/2021 by history - detached from 0900 to 12 oclock - Pt reports +family history of  RD in mother - s/p SBP + PPV/PFO/EL/FAX/14% C3F8 OD, 03.30.2023             - doing well              - retina attached and in good position -- good buckle height and laser around breaks  - gas bubble gone; vitreous clear  - BCVA improved to 20/25 from 20/40 +2             - IOP 12  - OCT shows Retina re-attached, interval improvement in central ellipsoid signal - discussed the effects of the scleral buckle on Mrx and that the healing process following RD repair can extend to 12+ mos  - f/u 9 mos -- DFE/OCT  2,3. Hypertensive retinopathy OU - discussed importance of tight BP control - continue to monitor   4. Pseudophakia OD  - s/p CE/IOL w/ Dr. Alanda Slim  - IOL in good position, doing well  - Cleared to get a new refraction   - continue to monitor    5. Mixed Cataract OS - The symptoms of cataract, surgical options, and treatments and risks were discussed with patient. - discussed diagnosis and progression - continue to monitor  Ophthalmic Meds Ordered this visit:  No orders of the defined types were placed in this encounter.    Return in about 9 months (around 05/13/2023) for f/u RD OD, DFE, OCT.  There are no Patient Instructions on file for this visit.   Explained the diagnoses, plan, and follow up with the patient and they expressed understanding.  Patient expressed understanding of the importance of proper follow up care.  This document serves as a record of services personally performed by Gardiner Sleeper, MD, PhD. It was created on their behalf by San Jetty. Owens Shark, OA an ophthalmic technician. The creation of this record is the provider's dictation and/or activities during  the visit.    Electronically signed by: San Jetty. Marguerita Merles 12.18.2023 12:33 PM  Gardiner Sleeper, M.D., Ph.D. Diseases & Surgery of the Retina and Vitreous [facility]  I have reviewed the above documentation for accuracy and completeness, and I agree with the above. Gardiner Sleeper, M.D., Ph.D. 08/12/22 12:33 PM  Abbreviations: M myopia (nearsighted); A astigmatism; H hyperopia (farsighted); P presbyopia; Mrx spectacle prescription;  CTL contact lenses; OD right eye; OS left eye; OU both eyes  XT exotropia; ET esotropia; PEK punctate epithelial keratitis; PEE punctate epithelial erosions; DES dry eye syndrome; MGD meibomian gland dysfunction; ATs artificial tears; PFAT's preservative free artificial tears; [place] nuclear sclerotic cataract; PSC posterior subcapsular cataract; ERM epi-retinal membrane; PVD posterior vitreous detachment; RD retinal detachment; DM diabetes mellitus; DR diabetic retinopathy; NPDR non-proliferative diabetic retinopathy; PDR proliferative diabetic retinopathy; CSME clinically significant macular edema; DME diabetic macular edema; dbh dot blot hemorrhages; CWS cotton wool spot; POAG primary open angle glaucoma; C/D cup-to-disc ratio; HVF humphrey visual field; GVF goldmann visual field; OCT optical coherence tomography; IOP intraocular pressure; BRVO Branch retinal vein occlusion; CRVO central retinal vein occlusion; CRAO central retinal artery occlusion; BRAO branch retinal artery occlusion; RT retinal tear; SB scleral buckle; PPV pars plana vitrectomy; VH Vitreous hemorrhage; PRP panretinal laser photocoagulation; IVK intravitreal kenalog; VMT vitreomacular traction; MH Macular hole;  NVD neovascularization of the disc; NVE neovascularization elsewhere; AREDS age related eye disease study; ARMD age related macular degeneration; POAG primary open angle glaucoma; EBMD epithelial/anterior basement membrane dystrophy; ACIOL anterior chamber intraocular lens; IOL intraocular lens; PCIOL posterior chamber intraocular lens; Phaco/IOL phacoemulsification with intraocular lens placement; [place] photorefractive keratectomy; LASIK laser assisted in situ keratomileusis; HTN hypertension; DM diabetes mellitus; COPD chronic obstructive pulmonary disease

## 2022-08-11 ENCOUNTER — Ambulatory Visit (INDEPENDENT_AMBULATORY_CARE_PROVIDER_SITE_OTHER): Payer: Federal, State, Local not specified - PPO | Admitting: Ophthalmology

## 2022-08-11 ENCOUNTER — Encounter (INDEPENDENT_AMBULATORY_CARE_PROVIDER_SITE_OTHER): Payer: Self-pay | Admitting: Ophthalmology

## 2022-08-11 DIAGNOSIS — I10 Essential (primary) hypertension: Secondary | ICD-10-CM

## 2022-08-11 DIAGNOSIS — Z961 Presence of intraocular lens: Secondary | ICD-10-CM | POA: Diagnosis not present

## 2022-08-11 DIAGNOSIS — H25812 Combined forms of age-related cataract, left eye: Secondary | ICD-10-CM

## 2022-08-11 DIAGNOSIS — H35033 Hypertensive retinopathy, bilateral: Secondary | ICD-10-CM | POA: Diagnosis not present

## 2022-08-11 DIAGNOSIS — H3321 Serous retinal detachment, right eye: Secondary | ICD-10-CM | POA: Diagnosis not present

## 2022-08-12 ENCOUNTER — Encounter (INDEPENDENT_AMBULATORY_CARE_PROVIDER_SITE_OTHER): Payer: Self-pay | Admitting: Ophthalmology

## 2022-09-05 DIAGNOSIS — H2512 Age-related nuclear cataract, left eye: Secondary | ICD-10-CM | POA: Diagnosis not present

## 2022-09-15 DIAGNOSIS — H269 Unspecified cataract: Secondary | ICD-10-CM | POA: Diagnosis not present

## 2022-09-15 DIAGNOSIS — H2512 Age-related nuclear cataract, left eye: Secondary | ICD-10-CM | POA: Diagnosis not present

## 2022-10-10 ENCOUNTER — Other Ambulatory Visit: Payer: Self-pay | Admitting: Urology

## 2022-10-10 DIAGNOSIS — C61 Malignant neoplasm of prostate: Secondary | ICD-10-CM

## 2022-11-09 ENCOUNTER — Ambulatory Visit
Admission: RE | Admit: 2022-11-09 | Discharge: 2022-11-09 | Disposition: A | Discharge status: Home / Self Care | Payer: Federal, State, Local not specified - PPO | Source: Ambulatory Visit | Attending: Urology | Admitting: Urology

## 2022-11-09 DIAGNOSIS — C61 Malignant neoplasm of prostate: Secondary | ICD-10-CM

## 2022-11-09 MED ORDER — GADOPICLENOL 0.5 MMOL/ML IV SOLN
10.0000 mL | Freq: Once | INTRAVENOUS | Status: AC | PRN
  Administered 2022-11-09: 10 mL via INTRAVENOUS

## 2022-11-15 DIAGNOSIS — C61 Malignant neoplasm of prostate: Secondary | ICD-10-CM | POA: Diagnosis not present

## 2022-11-22 DIAGNOSIS — C61 Malignant neoplasm of prostate: Secondary | ICD-10-CM | POA: Diagnosis not present

## 2022-11-22 DIAGNOSIS — N402 Nodular prostate without lower urinary tract symptoms: Secondary | ICD-10-CM | POA: Diagnosis not present

## 2022-11-30 DIAGNOSIS — R7303 Prediabetes: Secondary | ICD-10-CM | POA: Diagnosis not present

## 2022-11-30 DIAGNOSIS — L819 Disorder of pigmentation, unspecified: Secondary | ICD-10-CM | POA: Diagnosis not present

## 2022-11-30 DIAGNOSIS — Z0000 Encounter for general adult medical examination without abnormal findings: Secondary | ICD-10-CM | POA: Diagnosis not present

## 2022-11-30 DIAGNOSIS — M109 Gout, unspecified: Secondary | ICD-10-CM | POA: Diagnosis not present

## 2022-11-30 DIAGNOSIS — I10 Essential (primary) hypertension: Secondary | ICD-10-CM | POA: Diagnosis not present

## 2022-11-30 DIAGNOSIS — E785 Hyperlipidemia, unspecified: Secondary | ICD-10-CM | POA: Diagnosis not present

## 2022-11-30 DIAGNOSIS — Z13220 Encounter for screening for lipoid disorders: Secondary | ICD-10-CM | POA: Diagnosis not present

## 2023-01-23 IMAGING — MR MR PROSTATE WO/W CM
12 series · 48 of 48 positions shown · IV contrast (multihance)
Comparison: None.

CLINICAL DATA: Prostate cancer, Gleason 3+4=7 disease involving the
left lateral base in 10% of a core obtained on 10/21/2019. PSA level
of 1.11 on 04/27/2020.

EXAM:
MR PROSTATE WITHOUT AND WITH CONTRAST
TECHNIQUE: Multiplanar multisequence MRI images were obtained of the pelvis
centered about the prostate. Pre and post contrast images were
obtained.
CONTRAST:  19mL MULTIHANCE GADOBENATE DIMEGLUMINE 529 MG/ML IV SOLN

[Series 3: T2 · coronal · 3.0mm · 0.56mm/px · 1 of 24 slices shown (1 of 3)]
[im 1/24]
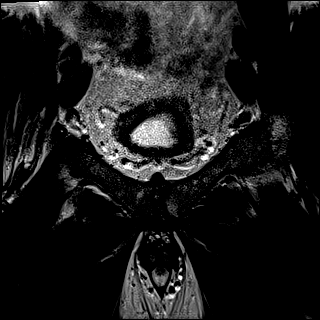

[Series 4: T1 · axial · 5.0mm · 1.25mm/px · 1 of 88 slices shown]
[im 1/88]
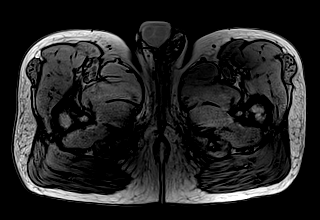

[Series 5: DWI · axial · 3.0mm · 1.75mm/px · z∈[-30,+42]mm · 2 of 75 slices shown (1 of 3)]
[im 1/75]
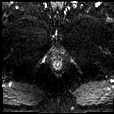
[im 75/75]
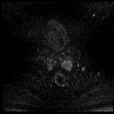

[Series 6: DWI · axial · 3.0mm · 1.75mm/px · 1 of 25 slices shown (2 of 3)]
[im 1/25]
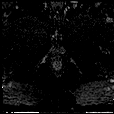

[Series 7: DWI · axial · 3.0mm · 1.75mm/px · 1 of 25 slices shown (3 of 3)]
[im 1/25]
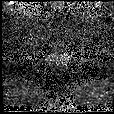

[Series 8: T2 · axial · 3.0mm · 0.56mm/px · 1 of 25 slices shown (2 of 3)]
[im 1/25]
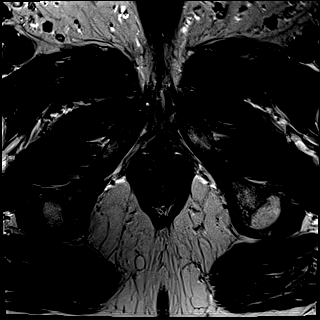

[Series 9: T2 · axial · 1.0mm · 1.04mm/px · z∈[-35,+44]mm · 2 of 80 slices shown (3 of 3)]
[im 1/80]
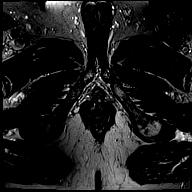
[im 80/80]
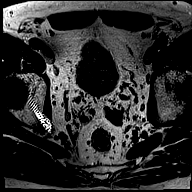

[Series 10: pre t1_twist_tra_dyn · axial · non-contrast · 3.5mm · 0.83mm/px · 1 of 22 slices shown]
[im 1/22]
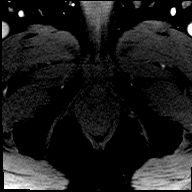

[Series 11: post t1_twist_tra_dyn-copy center · axial · non-contrast · 3.5mm · 0.83mm/px · z∈[-32,+41]mm · 17 of 660 slices shown]
[im 1/660]
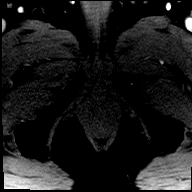
[im 42/660]
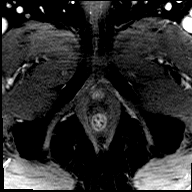
[im 83/660]
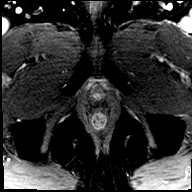
[im 124/660]
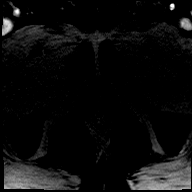
[im 165/660]
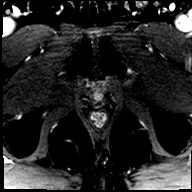
[im 206/660]
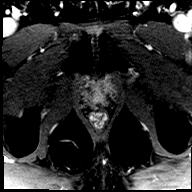
[im 248/660]
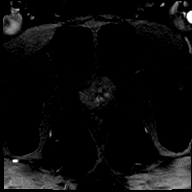
[im 289/660]
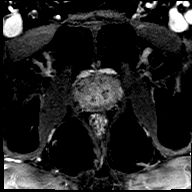
[im 330/660]
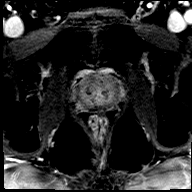
[im 371/660]
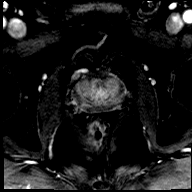
[im 412/660]
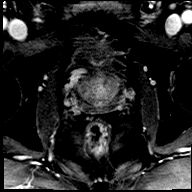
[im 454/660]
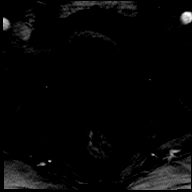
[im 495/660]
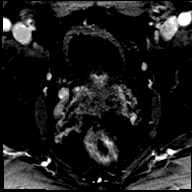
[im 536/660]
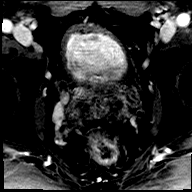
[im 577/660]
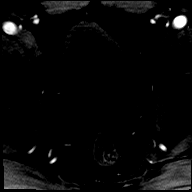
[im 618/660]
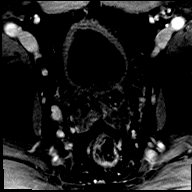
[im 660/660]
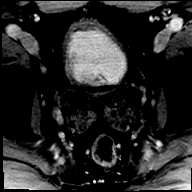

[Series 12: post t1_twist_tra_dyn-copy cent_sub · axial · 3.5mm · 0.83mm/px · z∈[-32,+41]mm · 17 of 638 slices shown]
[im 1/638]
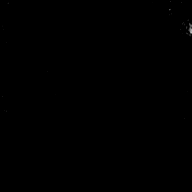
[im 40/638]
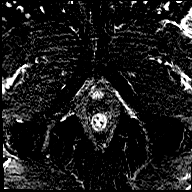
[im 80/638]
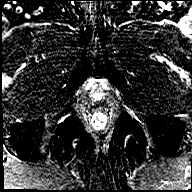
[im 120/638]
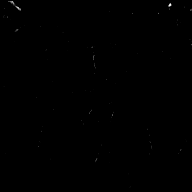
[im 160/638]
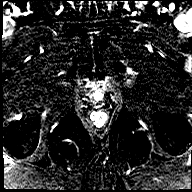
[im 200/638]
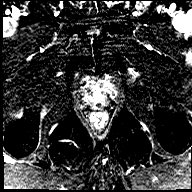
[im 239/638]
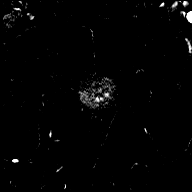
[im 279/638]
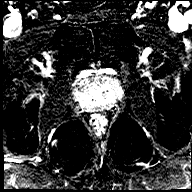
[im 319/638]
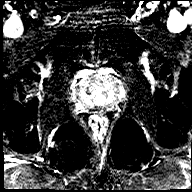
[im 359/638]
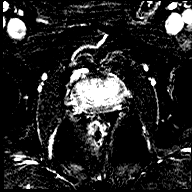
[im 399/638]
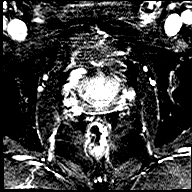
[im 438/638]
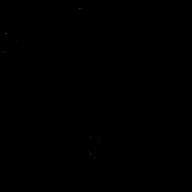
[im 478/638]
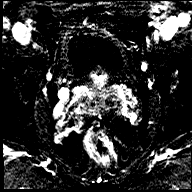
[im 518/638]
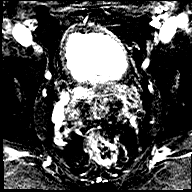
[im 558/638]
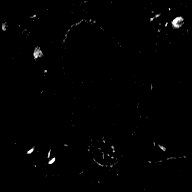
[im 598/638]
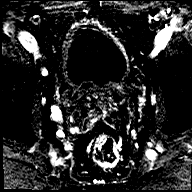
[im 638/638]
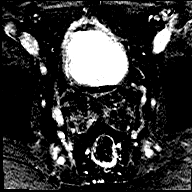

[Series 13: t1_vibe_dixon_tra_f · axial · 2.5mm · 0.91mm/px · z∈[-48,+149]mm · 2 of 80 slices shown]
[im 1/80]
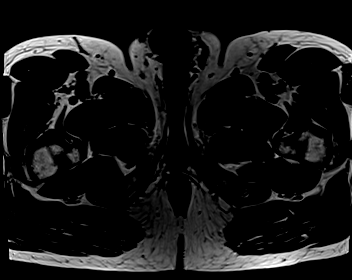
[im 80/80]
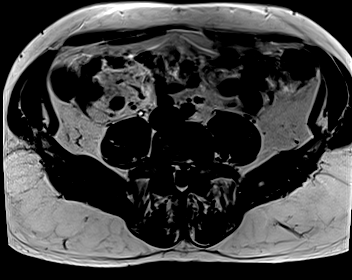

[Series 14: t1_vibe_dixon_tra_w · axial · 2.5mm · 0.91mm/px · z∈[-48,+149]mm · 2 of 80 slices shown]
[im 1/80]
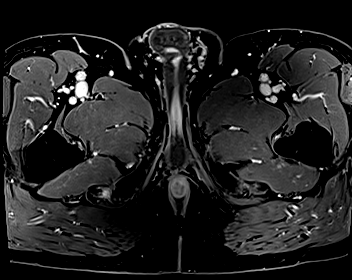
[im 80/80]
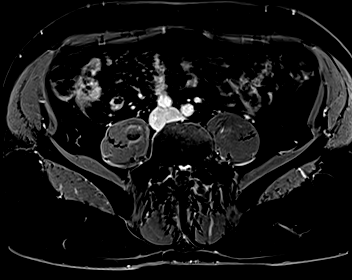

[48 of 48 positions shown; findings below may reference images not displayed]

FINDINGS: Prostate:

Hazy low T2 signal stranding is present in both peripheral zones in
a symmetric and non focal manner, likely postinflammatory and
considered PI-RADS category 2. No restriction of diffusion in the
prostate gland. There is diffuse early enhancement in the peripheral
zone, nonfocal and not considered of intermediate or high suspicion
for malignancy.

On dynamic imaging, there is diffuse early enhancement in the
peripheral zone.

Even with careful scrutiny of the left lateral base where there was
biopsy-proven adenocarcinoma 1 year ago, I not see a well-defined
lesion of intermediate or higher suspicion. Accordingly, no specific
region of interest is identified.

Volume: 3D volumetric analysis: Prostate volume 38.08 cubic cm (4.8
by 3.7 by 4.7 cm).

Transcapsular spread:  Absent

Seminal vesicle involvement: Absent

Neurovascular bundle involvement: Absent

Pelvic adenopathy: Absent

Bone metastasis: Absent

Other findings: No supplemental non-categorized findings.
IMPRESSION: 1. No specific region of interest is identified to correspond with
the patient's known adenocarcinoma at the left lateral base.
2. Hazy low T2 signal stranding in both peripheral zones, likely
postinflammatory.
3. Mild prostatomegaly. 08 cubic cm.

## 2023-05-11 ENCOUNTER — Encounter (INDEPENDENT_AMBULATORY_CARE_PROVIDER_SITE_OTHER): Payer: Federal, State, Local not specified - PPO | Admitting: Ophthalmology

## 2023-05-25 NOTE — Progress Notes (Signed)
[facility] - Clinic Note  05/26/2023    CHIEF COMPLAINT Patient presents for Retina Follow Up   HISTORY OF PRESENT ILLNESS: Cristian Mcgee is a 62 y.o. male who presents to the clinic today for:   HPI     Retina Follow Up   Patient presents with  Other.  In right eye.  This started 9 months ago.  I, the attending physician,  performed the HPI with the patient and updated documentation appropriately.        Comments   Patient here for 9 months retina follow up for RD OD. Patient states vision doing good. No eye pain. Had cataract surgery OS since last here. Mono vision OD Near and OS distance. Done with drops.       Last edited by [name], MD on 05/27/2023  9:40 PM.      Patient feels the vision is good.   Referring physician: [name], MD [address] [place],  [place] [address]  HISTORICAL INFORMATION:   Selected notes from the MEDICAL RECORD NUMBER Referred by Dr. [name] for RD OD LEE:  Ocular Hx- PMH-    CURRENT MEDICATIONS: Current Outpatient Medications (Ophthalmic Drugs)  Medication Sig   bacitracin-polymyxin b (POLYSPORIN) ophthalmic ointment Place 1 application. into the right eye 4 (four) times daily.   dorzolamide-timolol (COSOPT) 22.3-6.8 MG/ML ophthalmic solution Place 1 drop into the right eye daily.   gatifloxacin (ZYMAXID) 0.5 % SOLN Place 1 drop into the right eye 4 (four) times daily.   atropine 1 % ophthalmic solution Place into the right eye 2 (two) times daily. (Patient not taking: Reported on 12/02/2021)   prednisoLONE acetate (PRED FORTE) 1 % ophthalmic suspension Place 1 drop into the right eye 4 (four) times daily. (Patient not taking: Reported on 01/13/2022)   prednisoLONE acetate (PRED FORTE) 1 % ophthalmic suspension Place 1 drop into the right eye 4 (four) times daily. (Patient not taking: Reported on 12/23/2021)   No current facility-administered medications for this visit. (Ophthalmic Drugs)    Current Outpatient Medications (Other)  Medication Sig   allopurinol (ZYLOPRIM) 100 MG tablet Take 100 mg by mouth daily.   olmesartan (BENICAR) 20 MG tablet Take 20 mg by mouth daily.   No current facility-administered medications for this visit. (Other)   REVIEW OF SYSTEMS: ROS   Positive for: Eyes Negative for: Constitutional, Gastrointestinal, Neurological, Skin, Genitourinary, Musculoskeletal, HENT, Endocrine, Cardiovascular, Respiratory, Psychiatric, Allergic/Imm, Heme/Lymph Last edited by Laddie Aquas, COA on 05/26/2023  8:54 AM.      ALLERGIES No Known Allergies  PAST MEDICAL HISTORY Past Medical History:  Diagnosis Date   Hypertension    Prostate cancer [facility])    Past Surgical History:  Procedure Laterality Date   BICEPS TENDON REPAIR Right    "reattach bicep on right arm"- 18 years ago per patient (11/10/21)   CATARACT EXTRACTION Right    FRACTURE SURGERY Left    35 years ago   GAS INSERTION  11/11/2021   Procedure: INSERTION OF GAS;  Surgeon: [name], MD;  Location: [facility] OR;  Service: Ophthalmology;;  C3F8   GAS/FLUID EXCHANGE Right 11/11/2021   Procedure: GAS/FLUID EXCHANGE;  Surgeon: [name], MD;  Location: [facility] OR;  Service: Ophthalmology;  Laterality: Right;   PHOTOCOAGULATION WITH LASER Right 11/11/2021   Procedure: PHOTOCOAGULATION WITH LASER;  Surgeon: [name], MD;  Location: [facility] OR;  Service: Ophthalmology;  Laterality: Right;   TESTICLE REMOVAL  as a child had 1 testicle removed   VITRECTOMY 25 GAUGE WITH SCLERAL BUCKLE Right 11/11/2021   Procedure: VITRECTOMY 25 GAUGE WITH SCLERAL BUCKLE;  Surgeon: [name], MD;  Location: [facility] OR;  Service: Ophthalmology;  Laterality: Right;   FAMILY HISTORY Family History  Problem Relation Age of Onset   Retinal detachment Mother    Diabetes Father    SOCIAL HISTORY Social History   Tobacco Use   Smoking status: Never   Smokeless tobacco: Current    Types: Chew  Vaping Use   Vaping  status: Never Used  Substance Use Topics   Alcohol use: Yes    Alcohol/week: 12.0 standard drinks of alcohol    Types: 12 Cans of beer per week   Drug use: Never       OPHTHALMIC EXAM:  Base Eye Exam     Visual Acuity (Snellen - Linear)       Right Left   Dist [place] 20/20 -2 20/20   Near [place] J1+   Mono vision OD Near vision used [employer]. OS Distance vision         Tonometry (Tonopen, 8:48 AM)       Right Left   Pressure 13 15         Pupils       Dark Light Shape React APD   Right 4 3 Round Brisk None   Left 3 2 Round Brisk None         Visual Fields (Counting fingers)       Left Right    Full Full         Extraocular Movement       Right Left    Full, Ortho Full, Ortho         Neuro/Psych     Oriented x3: Yes   Mood/Affect: Normal         Dilation     Both eyes: 1.0% Mydriacyl, 2.5% Phenylephrine @ 8:48 AM           Slit Lamp and Fundus Exam     Slit Lamp Exam       Right Left   Lids/Lashes Dermatochalasis - upper lid, Meibomian gland dysfunction Dermatochalasis - upper lid, mild MGD   Conjunctiva/Sclera White and quiet, nasal and temporal pinguecula nasal and temporal pinguecula   Cornea Trace PEE, well healed cataract wound, tear film debris arcus, trace inferior Punctate epithelial erosions, Well healed cataract wound   Anterior Chamber deep and clear Deep and clear   Iris Round and dilated Round and dilated   Lens PC IOL in good position PC IOL in good postition   Anterior Vitreous Post vitrectomy, Posterior vitreous detachment Vitreous syneresis         Fundus Exam       Right Left   Disc Pink and Sharp Pink and Sharp, Compact   C/D Ratio 0.2 0.2   Macula Flat, Good foveal reflex, No heme or edema, trace ERM Flat, Good foveal reflex, RPE mottling, No heme or edema   Vessels attenuated, Tortuous attenuated, Tortuous   Periphery Retina attached over buckle, good buckle height, good laser changes over buckle Originally:  Bullous ST detachment from 0900-1200 extending posteriorly into macula, no obvious RT on depression, paving stone degeneration inferiorly Attached, mild pavingstone degeneration inferiorly            IMAGING AND PROCEDURES  Imaging and Procedures for 05/26/2023  OCT, Retina - OU - Both Eyes  Right Eye Quality was good. Central Foveal Thickness: 314. Progression has been stable. Findings include normal foveal contour, no IRF, no SRF (Retina stably re-attached).   Left Eye Quality was good. Central Foveal Thickness: 322. Progression has been stable. Findings include normal foveal contour, no IRF, no SRF, vitreomacular adhesion .   Notes *Images captured and stored on drive  Diagnosis / Impression:  OD: Retina stably re-attached OS: NFP; no IRF/SRF   Clinical management:  See below  Abbreviations: NFP - Normal foveal profile. CME - cystoid macular edema. PED - pigment epithelial detachment. IRF - intraretinal fluid. SRF - subretinal fluid. EZ - ellipsoid zone. ERM - epiretinal membrane. ORA - outer retinal atrophy. ORT - outer retinal tubulation. SRHM - subretinal hyper-reflective material. IRHM - intraretinal hyper-reflective material            ASSESSMENT/PLAN:    ICD-10-CM   1. Right retinal detachment  H33.21 OCT, Retina - OU - Both Eyes    2. Essential hypertension  I10     3. Hypertensive retinopathy of both eyes  H35.033     4. 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 Hypertensive retinopathy OU - discussed importance of tight BP control - continue to monitor   4. Pseudophakia OU  - s/p CE/IOL OU w/ Dr. Genia Del  - IOLs in good position, doing well  - monitor  Ophthalmic Meds Ordered this visit:  No orders of the defined types were placed in this encounter.    Return in about 1 year (around 05/25/2024) for f/u RD OD, DFE, OCT.  There are no Patient Instructions on file for this visit.   Electronically signed by: Berlin Hun COT 10.10.24 9:41 PM  This document serves as a record of services personally performed by Karie Chimera, MD, PhD. It was created on their behalf by Charlette Caffey, COT an ophthalmic technician. The creation of this record is the provider's dictation and/or activities during the visit.    Electronically signed by:  Charlette Caffey, COT  05/27/23 9:41 PM  Karie Chimera, M.D., Ph.D. Diseases & Surgery of the Retina and Vitreous [facility] 05/26/2023   I have reviewed the above documentation for accuracy and completeness, and I agree with the above. Karie Chimera, M.D., Ph.D. 05/27/23 9:44 PM   Abbreviations: M myopia (nearsighted); A astigmatism; H hyperopia (farsighted); P presbyopia; Mrx spectacle prescription;  CTL contact lenses; OD right eye; OS left eye; OU both eyes  XT exotropia; ET esotropia; PEK punctate epithelial keratitis; PEE punctate epithelial erosions; DES dry eye syndrome; MGD meibomian gland dysfunction; ATs artificial tears; PFAT's preservative free artificial tears; NSC nuclear sclerotic cataract; PSC posterior subcapsular cataract; ERM epi-retinal membrane; PVD  posterior vitreous detachment; RD retinal detachment; DM diabetes mellitus; DR diabetic retinopathy; NPDR non-proliferative diabetic retinopathy; PDR proliferative diabetic retinopathy; CSME clinically significant macular edema; DME diabetic macular edema; dbh dot  blot hemorrhages; CWS cotton wool spot; POAG primary open angle glaucoma; C/D cup-to-disc ratio; HVF humphrey visual field; GVF goldmann visual field; OCT optical coherence tomography; IOP intraocular pressure; BRVO Branch retinal vein occlusion; CRVO central retinal vein occlusion; CRAO central retinal artery occlusion; BRAO branch retinal artery occlusion; RT retinal tear; SB scleral buckle; PPV pars plana vitrectomy; VH Vitreous hemorrhage; PRP panretinal laser photocoagulation; IVK intravitreal kenalog; VMT vitreomacular traction; MH Macular hole;  NVD neovascularization of the disc; NVE neovascularization elsewhere; AREDS age related eye disease study; ARMD age related macular degeneration; POAG primary open angle glaucoma; EBMD epithelial/anterior basement membrane dystrophy; ACIOL anterior chamber intraocular lens; IOL intraocular lens; PCIOL posterior chamber intraocular lens; Phaco/IOL phacoemulsification with intraocular lens placement; PRK photorefractive keratectomy; LASIK laser assisted in situ keratomileusis; HTN hypertension; DM diabetes mellitus; COPD chronic obstructive pulmonary disease

## 2023-05-26 ENCOUNTER — Encounter (INDEPENDENT_AMBULATORY_CARE_PROVIDER_SITE_OTHER): Payer: Self-pay | Admitting: Ophthalmology

## 2023-05-26 ENCOUNTER — Ambulatory Visit (INDEPENDENT_AMBULATORY_CARE_PROVIDER_SITE_OTHER): Payer: Federal, State, Local not specified - PPO | Admitting: Ophthalmology

## 2023-05-26 DIAGNOSIS — H35033 Hypertensive retinopathy, bilateral: Secondary | ICD-10-CM | POA: Diagnosis not present

## 2023-05-26 DIAGNOSIS — H3321 Serous retinal detachment, right eye: Secondary | ICD-10-CM

## 2023-05-26 DIAGNOSIS — Z961 Presence of intraocular lens: Secondary | ICD-10-CM | POA: Diagnosis not present

## 2023-05-26 DIAGNOSIS — I10 Essential (primary) hypertension: Secondary | ICD-10-CM | POA: Diagnosis not present

## 2023-05-26 DIAGNOSIS — H25812 Combined forms of age-related cataract, left eye: Secondary | ICD-10-CM

## 2023-05-27 ENCOUNTER — Encounter (INDEPENDENT_AMBULATORY_CARE_PROVIDER_SITE_OTHER): Payer: Self-pay | Admitting: Ophthalmology

## 2023-06-13 DIAGNOSIS — C61 Malignant neoplasm of prostate: Secondary | ICD-10-CM | POA: Diagnosis not present

## 2023-06-20 DIAGNOSIS — C61 Malignant neoplasm of prostate: Secondary | ICD-10-CM | POA: Diagnosis not present

## 2023-06-20 DIAGNOSIS — N5201 Erectile dysfunction due to arterial insufficiency: Secondary | ICD-10-CM | POA: Diagnosis not present

## 2023-06-20 DIAGNOSIS — N402 Nodular prostate without lower urinary tract symptoms: Secondary | ICD-10-CM | POA: Diagnosis not present

## 2023-06-21 DIAGNOSIS — K080 Exfoliation of teeth due to systemic causes: Secondary | ICD-10-CM | POA: Diagnosis not present

## 2023-08-23 DIAGNOSIS — L538 Other specified erythematous conditions: Secondary | ICD-10-CM | POA: Diagnosis not present

## 2023-08-23 DIAGNOSIS — L570 Actinic keratosis: Secondary | ICD-10-CM | POA: Diagnosis not present

## 2023-08-23 DIAGNOSIS — D225 Melanocytic nevi of trunk: Secondary | ICD-10-CM | POA: Diagnosis not present

## 2023-08-23 DIAGNOSIS — L814 Other melanin hyperpigmentation: Secondary | ICD-10-CM | POA: Diagnosis not present

## 2023-08-23 DIAGNOSIS — D492 Neoplasm of unspecified behavior of bone, soft tissue, and skin: Secondary | ICD-10-CM | POA: Diagnosis not present

## 2023-08-23 DIAGNOSIS — L821 Other seborrheic keratosis: Secondary | ICD-10-CM | POA: Diagnosis not present

## 2023-08-23 DIAGNOSIS — L820 Inflamed seborrheic keratosis: Secondary | ICD-10-CM | POA: Diagnosis not present

## 2023-12-04 DIAGNOSIS — I10 Essential (primary) hypertension: Secondary | ICD-10-CM | POA: Diagnosis not present

## 2023-12-04 DIAGNOSIS — E785 Hyperlipidemia, unspecified: Secondary | ICD-10-CM | POA: Diagnosis not present

## 2023-12-04 DIAGNOSIS — M109 Gout, unspecified: Secondary | ICD-10-CM | POA: Diagnosis not present

## 2023-12-04 DIAGNOSIS — R7303 Prediabetes: Secondary | ICD-10-CM | POA: Diagnosis not present

## 2023-12-04 DIAGNOSIS — Z0000 Encounter for general adult medical examination without abnormal findings: Secondary | ICD-10-CM | POA: Diagnosis not present

## 2024-01-02 DIAGNOSIS — D075 Carcinoma in situ of prostate: Secondary | ICD-10-CM | POA: Diagnosis not present

## 2024-01-02 DIAGNOSIS — C61 Malignant neoplasm of prostate: Secondary | ICD-10-CM | POA: Diagnosis not present

## 2024-01-09 DIAGNOSIS — N5201 Erectile dysfunction due to arterial insufficiency: Secondary | ICD-10-CM | POA: Diagnosis not present

## 2024-01-09 DIAGNOSIS — C61 Malignant neoplasm of prostate: Secondary | ICD-10-CM | POA: Diagnosis not present

## 2024-01-09 DIAGNOSIS — N402 Nodular prostate without lower urinary tract symptoms: Secondary | ICD-10-CM | POA: Diagnosis not present

## 2024-05-06 ENCOUNTER — Encounter (INDEPENDENT_AMBULATORY_CARE_PROVIDER_SITE_OTHER): Admitting: Ophthalmology

## 2024-05-06 ENCOUNTER — Encounter (INDEPENDENT_AMBULATORY_CARE_PROVIDER_SITE_OTHER): Payer: Federal, State, Local not specified - PPO | Admitting: Ophthalmology

## 2024-05-06 NOTE — Progress Notes (Signed)
 [facility] - Clinic Note  05/10/2024    CHIEF COMPLAINT Patient presents for Retina Follow Up   HISTORY OF PRESENT ILLNESS: Cristian Mcgee is a 63 y.o. male who presents to the clinic today for:   HPI     Retina Follow Up   Patient presents with  Retinal Break/Detachment.  In right eye.  Severity is moderate.  Duration of 12 months.  Since onset it is stable.  I, the attending physician,  performed the HPI with the patient and updated documentation appropriately.        Comments   Pt here for 1 year ret f/u RD OD. Pt states VA is stable, no changes. No new FOL or floaters reported.       Last edited by Valdemar Rogue, MD on 05/15/2024  7:52 PM.    Patient feels the vision is good.   Referring physician: Loretha Richerd SAUNDERS, MD [address] [place],  [place] [address]  HISTORICAL INFORMATION:   Selected notes from the MEDICAL RECORD NUMBER Referred by Dr. Ross Daniels for RD OD LEE:  Ocular Hx- PMH-    CURRENT MEDICATIONS: Current Outpatient Medications (Ophthalmic Drugs)  Medication Sig   atropine  1 % ophthalmic solution Place into the right eye 2 (two) times daily. (Patient not taking: Reported on 12/02/2021)   bacitracin -polymyxin b  (POLYSPORIN ) ophthalmic ointment Place 1 application. into the right eye 4 (four) times daily.   dorzolamide -timolol  (COSOPT ) 22.3-6.8 MG/ML ophthalmic solution Place 1 drop into the right eye daily.   gatifloxacin  (ZYMAXID ) 0.5 % SOLN Place 1 drop into the right eye 4 (four) times daily.   prednisoLONE  acetate (PRED FORTE ) 1 % ophthalmic suspension Place 1 drop into the right eye 4 (four) times daily. (Patient not taking: Reported on 01/13/2022)   prednisoLONE  acetate (PRED FORTE ) 1 % ophthalmic suspension Place 1 drop into the right eye 4 (four) times daily. (Patient not taking: Reported on 12/23/2021)   No current facility-administered medications for this visit. (Ophthalmic Drugs)   Current Outpatient Medications  (Other)  Medication Sig   allopurinol (ZYLOPRIM) 100 MG tablet Take 100 mg by mouth daily.   olmesartan (BENICAR) 20 MG tablet Take 20 mg by mouth daily.   No current facility-administered medications for this visit. (Other)   REVIEW OF SYSTEMS: ROS   Positive for: Eyes Negative for: Constitutional, Gastrointestinal, Neurological, Skin, Genitourinary, Musculoskeletal, HENT, Endocrine, Cardiovascular, Respiratory, Psychiatric, Allergic/Imm, Heme/Lymph Last edited by Antonetta Almetta BRAVO, COT on 05/10/2024  8:15 AM.     ALLERGIES No Known Allergies  PAST MEDICAL HISTORY Past Medical History:  Diagnosis Date   Hypertension    Prostate cancer [facility])    Past Surgical History:  Procedure Laterality Date   BICEPS TENDON REPAIR Right    reattach bicep on right arm- 18 years ago per patient (11/10/21)   CATARACT EXTRACTION Right    FRACTURE SURGERY Left    35 years ago   GAS INSERTION  11/11/2021   Procedure: INSERTION OF GAS;  Surgeon: Valdemar Rogue, MD;  Location: [facility] OR;  Service: Ophthalmology;;  C3F8   GAS/FLUID EXCHANGE Right 11/11/2021   Procedure: GAS/FLUID EXCHANGE;  Surgeon: Valdemar Rogue, MD;  Location: [facility] OR;  Service: Ophthalmology;  Laterality: Right;   PHOTOCOAGULATION WITH LASER Right 11/11/2021   Procedure: PHOTOCOAGULATION WITH LASER;  Surgeon: Valdemar Rogue, MD;  Location: [facility] OR;  Service: Ophthalmology;  Laterality: Right;   TESTICLE REMOVAL     as a child had 1 testicle removed  VITRECTOMY 25 GAUGE WITH SCLERAL BUCKLE Right 11/11/2021   Procedure: VITRECTOMY 25 GAUGE WITH SCLERAL BUCKLE;  Surgeon: Valdemar Rogue, MD;  Location: [facility] OR;  Service: Ophthalmology;  Laterality: Right;   FAMILY HISTORY Family History  Problem Relation Age of Onset   Retinal detachment Mother    Diabetes Father    SOCIAL HISTORY Social History   Tobacco Use   Smoking status: Never   Smokeless tobacco: Current    Types: Chew  Vaping Use   Vaping status: Never Used  Substance Use  Topics   Alcohol use: Yes    Alcohol/week: 12.0 standard drinks of alcohol    Types: 12 Cans of beer per week   Drug use: Never       OPHTHALMIC EXAM:  Base Eye Exam     Visual Acuity (Snellen - Linear)       Right Left   Dist [place] 20/20 20/20  OD is NVA, OS DVA-monovison lenses MS        Tonometry (Tonopen, 8:22 AM)       Right Left   Pressure 20 19         Pupils       Dark Light Shape React APD   Right 4 3 Round Brisk None   Left 3 2 Round Brisk None         Neuro/Psych     Oriented x3: Yes   Mood/Affect: Normal         Dilation     Both eyes: 1.0% Mydriacyl , 2.5% Phenylephrine  @ 8:22 AM           Slit Lamp and Fundus Exam     Slit Lamp Exam       Right Left   Lids/Lashes Dermatochalasis - upper lid, Meibomian gland dysfunction Dermatochalasis - upper lid, mild MGD   Conjunctiva/Sclera White and quiet, nasal and temporal pinguecula nasal and temporal pinguecula   Cornea well healed cataract wound, tear film debris arcus, Well healed cataract wound   Anterior Chamber deep and clear Deep and clear   Iris Round and dilated Round and dilated   Lens PC IOL in good position PC IOL in good postition   Anterior Vitreous Post vitrectomy, Posterior vitreous detachment Vitreous syneresis, Posterior vitreous detachment, Vitreous condensations         Fundus Exam       Right Left   Disc Pink and Sharp Pink and Sharp, Compact   C/D Ratio 0.2 0.2   Macula Flat, Good foveal reflex, No heme or edema, trace ERM Flat, Good foveal reflex, RPE mottling, No heme or edema   Vessels attenuated, Tortuous attenuated, Tortuous   Periphery Retina attached over buckle, good buckle height, good laser changes over buckle Originally: Bullous ST detachment from 0900-1200 extending posteriorly into macula, no obvious RT on depression, paving stone degeneration inferiorly Attached, mild pavingstone degeneration inferiorly            IMAGING AND PROCEDURES  Imaging  and Procedures for 05/10/2024  OCT, Retina - OU - Both Eyes       Right Eye Quality was good. Central Foveal Thickness: 331. Progression has been stable. Findings include normal foveal contour, no IRF, no SRF (Retina stably re-attached, mild ERM).   Left Eye Quality was good. Central Foveal Thickness: 314. Progression has been stable. Findings include normal foveal contour, no IRF, no SRF, vitreomacular adhesion (Prominent vit opacities on en face image).   Notes *Images captured and stored on drive  Diagnosis /  Impression:  OD: Retina stably re-attached OS: NFP; no IRF/SRF   Clinical management:  See below  Abbreviations: NFP - Normal foveal profile. CME - cystoid macular edema. PED - pigment epithelial detachment. IRF - intraretinal fluid. SRF - subretinal fluid. EZ - ellipsoid zone. ERM - epiretinal membrane. ORA - outer retinal atrophy. ORT - outer retinal tubulation. SRHM - subretinal hyper-reflective material. IRHM - intraretinal hyper-reflective material            ASSESSMENT/PLAN:    ICD-10-CM   1. Right retinal detachment  H33.21 OCT, Retina - OU - Both Eyes    2. Essential hypertension  I10     3. Hypertensive retinopathy of both eyes  H35.033     4. 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 Hypertensive retinopathy OU - discussed importance of tight BP control - continue to monitor   4. Pseudophakia OU  - s/p CE/IOL OU w/ Dr. Regenia  - IOLs in  good position, doing well  - monitor  Ophthalmic Meds Ordered this visit:  No orders of the defined types were placed in this encounter.    Return in about 1 year (around 05/10/2025) for f/u RD OD, DFE, OCT.  There are no Patient Instructions on file for this visit.  This document serves as a record of services personally performed by Redell JUDITHANN Hans, MD, PhD. It was created on their behalf by Delon Newness COT, an ophthalmic technician. The creation of this record is the provider's dictation and/or activities during the visit.    Electronically signed by: Delon Newness COT 10.10.24 7:53 PM  This document serves as a record of services personally performed by Redell JUDITHANN Hans, MD, PhD. It was created on their behalf by Wanda GEANNIE Keens, COT an ophthalmic technician. The creation of this record is the provider's dictation and/or activities during the visit.    Electronically signed by:  Wanda GEANNIE Keens, COT  05/15/24 7:53 PM  Redell JUDITHANN Hans, M.D., Ph.D. Diseases & Surgery of the Retina and Vitreous [facility] 05/10/2024   I have reviewed the above documentation for accuracy and completeness, and I agree with the above. Redell JUDITHANN Hans, M.D., Ph.D. 05/15/24 7:56 PM   Abbreviations: M myopia (nearsighted); A astigmatism; H hyperopia (farsighted); P presbyopia; Mrx spectacle prescription;  CTL contact lenses; OD right eye; OS left eye; OU both eyes  XT exotropia; ET esotropia; PEK punctate epithelial keratitis; PEE punctate epithelial erosions; DES dry eye syndrome; MGD meibomian gland dysfunction; ATs artificial tears; PFAT's preservative free artificial tears; NSC nuclear sclerotic cataract; PSC posterior subcapsular cataract; ERM epi-retinal membrane; PVD posterior vitreous detachment; RD retinal detachment; DM diabetes mellitus; DR diabetic retinopathy; NPDR non-proliferative diabetic retinopathy; PDR proliferative diabetic retinopathy; CSME  clinically significant macular edema; DME diabetic macular edema; dbh  dot blot hemorrhages; CWS cotton wool spot; POAG primary open angle glaucoma; C/D cup-to-disc ratio; HVF humphrey visual field; GVF goldmann visual field; OCT optical coherence tomography; IOP intraocular pressure; BRVO Branch retinal vein occlusion; CRVO central retinal vein occlusion; CRAO central retinal artery occlusion; BRAO branch retinal artery occlusion; RT retinal tear; SB scleral buckle; PPV pars plana vitrectomy; VH Vitreous hemorrhage; PRP panretinal laser photocoagulation; IVK intravitreal kenalog ; VMT vitreomacular traction; MH Macular hole;  NVD neovascularization of the disc; NVE neovascularization elsewhere; AREDS age related eye disease study; ARMD age related macular degeneration; POAG primary open angle glaucoma; EBMD epithelial/anterior basement membrane dystrophy; ACIOL anterior chamber intraocular lens; IOL intraocular lens; PCIOL posterior chamber intraocular lens; Phaco/IOL phacoemulsification with intraocular lens placement; PRK photorefractive keratectomy; LASIK laser assisted in situ keratomileusis; HTN hypertension; DM diabetes mellitus; COPD chronic obstructive pulmonary disease

## 2024-05-10 ENCOUNTER — Encounter (INDEPENDENT_AMBULATORY_CARE_PROVIDER_SITE_OTHER): Payer: Self-pay | Admitting: Ophthalmology

## 2024-05-10 ENCOUNTER — Ambulatory Visit (INDEPENDENT_AMBULATORY_CARE_PROVIDER_SITE_OTHER): Admitting: Ophthalmology

## 2024-05-10 DIAGNOSIS — H3321 Serous retinal detachment, right eye: Secondary | ICD-10-CM | POA: Diagnosis not present

## 2024-05-10 DIAGNOSIS — I10 Essential (primary) hypertension: Secondary | ICD-10-CM | POA: Diagnosis not present

## 2024-05-10 DIAGNOSIS — H35033 Hypertensive retinopathy, bilateral: Secondary | ICD-10-CM

## 2024-05-10 DIAGNOSIS — Z961 Presence of intraocular lens: Secondary | ICD-10-CM

## 2024-05-15 ENCOUNTER — Encounter (INDEPENDENT_AMBULATORY_CARE_PROVIDER_SITE_OTHER): Payer: Self-pay | Admitting: Ophthalmology

## 2024-07-01 DIAGNOSIS — C61 Malignant neoplasm of prostate: Secondary | ICD-10-CM | POA: Diagnosis not present

## 2024-07-08 DIAGNOSIS — C61 Malignant neoplasm of prostate: Secondary | ICD-10-CM | POA: Diagnosis not present

## 2024-07-08 DIAGNOSIS — N5201 Erectile dysfunction due to arterial insufficiency: Secondary | ICD-10-CM | POA: Diagnosis not present

## 2024-07-09 ENCOUNTER — Other Ambulatory Visit: Payer: Self-pay | Admitting: Urology

## 2024-07-09 DIAGNOSIS — C61 Malignant neoplasm of prostate: Secondary | ICD-10-CM

## 2024-12-16 ENCOUNTER — Other Ambulatory Visit

## 2025-05-12 ENCOUNTER — Encounter (INDEPENDENT_AMBULATORY_CARE_PROVIDER_SITE_OTHER): Admitting: Ophthalmology
